# Patient Record
Sex: Female | Born: 1945
Health system: Southern US, Community
[De-identification: ages and names within clinical notes are randomized; demographics above are authoritative.]

## PROBLEM LIST (undated history)

## (undated) DIAGNOSIS — M8580 Other specified disorders of bone density and structure, unspecified site: Secondary | ICD-10-CM

## (undated) DIAGNOSIS — E7800 Pure hypercholesterolemia, unspecified: Secondary | ICD-10-CM

## (undated) DIAGNOSIS — M5090 Cervical disc disorder, unspecified, unspecified cervical region: Secondary | ICD-10-CM

## (undated) DIAGNOSIS — C9110 Chronic lymphocytic leukemia of B-cell type not having achieved remission: Secondary | ICD-10-CM

## (undated) DIAGNOSIS — K635 Polyp of colon: Secondary | ICD-10-CM

## (undated) DIAGNOSIS — M459 Ankylosing spondylitis of unspecified sites in spine: Secondary | ICD-10-CM

## (undated) DIAGNOSIS — I10 Essential (primary) hypertension: Secondary | ICD-10-CM

## (undated) DIAGNOSIS — J302 Other seasonal allergic rhinitis: Secondary | ICD-10-CM

## (undated) DIAGNOSIS — M069 Rheumatoid arthritis, unspecified: Secondary | ICD-10-CM

## (undated) DIAGNOSIS — Z789 Other specified health status: Secondary | ICD-10-CM

## (undated) DIAGNOSIS — R87619 Unspecified abnormal cytological findings in specimens from cervix uteri: Secondary | ICD-10-CM

## (undated) DIAGNOSIS — R9389 Abnormal findings on diagnostic imaging of other specified body structures: Secondary | ICD-10-CM

## (undated) DIAGNOSIS — M1990 Unspecified osteoarthritis, unspecified site: Secondary | ICD-10-CM

## (undated) DIAGNOSIS — F32A Depression, unspecified: Secondary | ICD-10-CM

## (undated) DIAGNOSIS — A0472 Enterocolitis due to Clostridium difficile, not specified as recurrent: Secondary | ICD-10-CM

## (undated) DIAGNOSIS — F329 Major depressive disorder, single episode, unspecified: Secondary | ICD-10-CM

## (undated) DIAGNOSIS — M810 Age-related osteoporosis without current pathological fracture: Secondary | ICD-10-CM

## (undated) DIAGNOSIS — K589 Irritable bowel syndrome without diarrhea: Secondary | ICD-10-CM

## (undated) HISTORY — DX: Pure hypercholesterolemia, unspecified: E78.00

## (undated) HISTORY — DX: Abnormal findings on diagnostic imaging of other specified body structures: R93.89

## (undated) HISTORY — DX: Essential (primary) hypertension: I10

## (undated) HISTORY — DX: Rheumatoid arthritis, unspecified: M06.9

## (undated) HISTORY — DX: Irritable bowel syndrome, unspecified: K58.9

## (undated) HISTORY — DX: Age-related osteoporosis without current pathological fracture: M81.0

## (undated) HISTORY — DX: Cervical disc disorder, unspecified, unspecified cervical region: M50.90

## (undated) HISTORY — DX: Chronic lymphocytic leukemia of B-cell type not having achieved remission: C91.10

## (undated) HISTORY — DX: Polyp of colon: K63.5

## (undated) HISTORY — PX: COLONOSCOPY W/ POLYPECTOMY: SHX1380

## (undated) HISTORY — PX: ENDOMETRIAL BIOPSY: SHX622

## (undated) HISTORY — DX: Other specified health status: Z78.9

## (undated) HISTORY — DX: Other seasonal allergic rhinitis: J30.2

## (undated) HISTORY — DX: Major depressive disorder, single episode, unspecified: F32.9

## (undated) HISTORY — PX: TUBAL LIGATION: SHX77

## (undated) HISTORY — DX: Unspecified osteoarthritis, unspecified site: M19.90

## (undated) HISTORY — DX: Depression, unspecified: F32.A

## (undated) HISTORY — DX: Other specified disorders of bone density and structure, unspecified site: M85.80

## (undated) HISTORY — DX: Ankylosing spondylitis of unspecified sites in spine: M45.9

## (undated) HISTORY — DX: Unspecified abnormal cytological findings in specimens from cervix uteri: R87.619

## (undated) HISTORY — DX: Enterocolitis due to Clostridium difficile, not specified as recurrent: A04.72

---

## 1998-10-23 ENCOUNTER — Other Ambulatory Visit: Admission: RE | Admit: 1998-10-23 | Discharge: 1998-10-23 | Payer: Self-pay | Admitting: Obstetrics and Gynecology

## 1998-12-15 ENCOUNTER — Ambulatory Visit (HOSPITAL_COMMUNITY): Admission: RE | Admit: 1998-12-15 | Discharge: 1998-12-15 | Payer: Self-pay | Admitting: Gastroenterology

## 2000-04-17 ENCOUNTER — Other Ambulatory Visit: Admission: RE | Admit: 2000-04-17 | Discharge: 2000-04-17 | Payer: Self-pay | Admitting: Obstetrics and Gynecology

## 2000-04-18 ENCOUNTER — Encounter: Admission: RE | Admit: 2000-04-18 | Discharge: 2000-04-18 | Payer: Self-pay | Admitting: Obstetrics and Gynecology

## 2000-04-18 ENCOUNTER — Encounter: Payer: Self-pay | Admitting: Obstetrics and Gynecology

## 2000-07-01 ENCOUNTER — Emergency Department (HOSPITAL_COMMUNITY): Admission: EM | Admit: 2000-07-01 | Discharge: 2000-07-01 | Payer: Self-pay | Admitting: Emergency Medicine

## 2001-04-30 ENCOUNTER — Other Ambulatory Visit: Admission: RE | Admit: 2001-04-30 | Discharge: 2001-04-30 | Payer: Self-pay | Admitting: Obstetrics and Gynecology

## 2001-05-23 ENCOUNTER — Encounter: Admission: RE | Admit: 2001-05-23 | Discharge: 2001-05-23 | Payer: Self-pay | Admitting: Obstetrics and Gynecology

## 2001-05-23 ENCOUNTER — Encounter: Payer: Self-pay | Admitting: Obstetrics and Gynecology

## 2002-05-27 ENCOUNTER — Encounter: Payer: Self-pay | Admitting: Obstetrics and Gynecology

## 2002-05-27 ENCOUNTER — Encounter: Admission: RE | Admit: 2002-05-27 | Discharge: 2002-05-27 | Payer: Self-pay | Admitting: Obstetrics and Gynecology

## 2002-08-15 ENCOUNTER — Other Ambulatory Visit: Admission: RE | Admit: 2002-08-15 | Discharge: 2002-08-15 | Payer: Self-pay | Admitting: Obstetrics and Gynecology

## 2003-05-30 ENCOUNTER — Encounter: Admission: RE | Admit: 2003-05-30 | Discharge: 2003-05-30 | Payer: Self-pay | Admitting: Obstetrics and Gynecology

## 2003-09-05 ENCOUNTER — Other Ambulatory Visit: Admission: RE | Admit: 2003-09-05 | Discharge: 2003-09-05 | Payer: Self-pay | Admitting: Obstetrics and Gynecology

## 2004-02-24 ENCOUNTER — Other Ambulatory Visit: Admission: RE | Admit: 2004-02-24 | Discharge: 2004-02-24 | Payer: Self-pay | Admitting: Hematology and Oncology

## 2004-03-19 ENCOUNTER — Ambulatory Visit (HOSPITAL_COMMUNITY): Admission: RE | Admit: 2004-03-19 | Discharge: 2004-03-19 | Payer: Self-pay | Admitting: Hematology and Oncology

## 2004-05-22 ENCOUNTER — Ambulatory Visit: Payer: Self-pay | Admitting: Hematology and Oncology

## 2004-05-31 ENCOUNTER — Encounter: Admission: RE | Admit: 2004-05-31 | Discharge: 2004-05-31 | Payer: Self-pay | Admitting: *Deleted

## 2004-09-09 ENCOUNTER — Other Ambulatory Visit: Admission: RE | Admit: 2004-09-09 | Discharge: 2004-09-09 | Payer: Self-pay | Admitting: *Deleted

## 2004-11-29 ENCOUNTER — Ambulatory Visit: Payer: Self-pay | Admitting: Hematology and Oncology

## 2005-06-01 ENCOUNTER — Ambulatory Visit: Payer: Self-pay | Admitting: Hematology and Oncology

## 2005-07-14 ENCOUNTER — Encounter: Admission: RE | Admit: 2005-07-14 | Discharge: 2005-07-14 | Payer: Self-pay | Admitting: *Deleted

## 2005-10-13 ENCOUNTER — Other Ambulatory Visit: Admission: RE | Admit: 2005-10-13 | Discharge: 2005-10-13 | Payer: Self-pay | Admitting: Obstetrics & Gynecology

## 2006-01-21 ENCOUNTER — Encounter: Admission: RE | Admit: 2006-01-21 | Discharge: 2006-01-21 | Payer: Self-pay | Admitting: Obstetrics & Gynecology

## 2006-02-28 ENCOUNTER — Ambulatory Visit: Payer: Self-pay | Admitting: Hematology and Oncology

## 2006-03-02 LAB — CBC WITH DIFFERENTIAL/PLATELET
BASO%: 0.2 % (ref 0.0–2.0)
Basophils Absolute: 0.1 10*3/uL (ref 0.0–0.1)
EOS%: 0.6 % (ref 0.0–7.0)
Eosinophils Absolute: 0.1 10*3/uL (ref 0.0–0.5)
HCT: 37.8 % (ref 34.8–46.6)
HGB: 12.9 g/dL (ref 11.6–15.9)
LYMPH%: 72 % — ABNORMAL HIGH (ref 14.0–48.0)
MCH: 30 pg (ref 26.0–34.0)
MCHC: 34.2 g/dL (ref 32.0–36.0)
MCV: 87.9 fL (ref 81.0–101.0)
MONO#: 0.9 10*3/uL (ref 0.1–0.9)
MONO%: 3.9 % (ref 0.0–13.0)
NEUT#: 5.3 10*3/uL (ref 1.5–6.5)
NEUT%: 23.3 % — ABNORMAL LOW (ref 39.6–76.8)
Platelets: 280 10*3/uL (ref 145–400)
RBC: 4.3 10*6/uL (ref 3.70–5.32)
RDW: 13.6 % (ref 11.3–14.5)
WBC: 22.6 10*3/uL — ABNORMAL HIGH (ref 3.9–10.0)
lymph#: 16.3 10*3/uL — ABNORMAL HIGH (ref 0.9–3.3)

## 2006-03-02 LAB — TECHNOLOGIST REVIEW

## 2006-03-03 LAB — PROTEIN ELECTROPHORESIS, SERUM
Albumin ELP: 62.4 % (ref 55.8–66.1)
Alpha-1-Globulin: 5.3 % — ABNORMAL HIGH (ref 2.9–4.9)
Alpha-2-Globulin: 11.3 % (ref 7.1–11.8)
Beta 2: 4.1 % (ref 3.2–6.5)
Beta Globulin: 6.6 % (ref 4.7–7.2)
Gamma Globulin: 10.3 % — ABNORMAL LOW (ref 11.1–18.8)
Total Protein, Serum Electrophoresis: 6.6 g/dL (ref 6.0–8.3)

## 2006-03-03 LAB — LACTATE DEHYDROGENASE: LDH: 117 U/L (ref 94–250)

## 2006-03-03 LAB — COMPREHENSIVE METABOLIC PANEL
ALT: 17 U/L (ref 0–40)
AST: 15 U/L (ref 0–37)
Albumin: 4.4 g/dL (ref 3.5–5.2)
Alkaline Phosphatase: 77 U/L (ref 39–117)
BUN: 10 mg/dL (ref 6–23)
CO2: 27 mEq/L (ref 19–32)
Calcium: 9.6 mg/dL (ref 8.4–10.5)
Chloride: 101 mEq/L (ref 96–112)
Creatinine, Ser: 0.61 mg/dL (ref 0.40–1.20)
Glucose, Bld: 119 mg/dL — ABNORMAL HIGH (ref 70–99)
Potassium: 3.8 mEq/L (ref 3.5–5.3)
Sodium: 137 mEq/L (ref 135–145)
Total Bilirubin: 0.4 mg/dL (ref 0.3–1.2)
Total Protein: 6.6 g/dL (ref 6.0–8.3)

## 2006-03-10 ENCOUNTER — Encounter (INDEPENDENT_AMBULATORY_CARE_PROVIDER_SITE_OTHER): Payer: Self-pay | Admitting: Specialist

## 2006-03-10 ENCOUNTER — Ambulatory Visit (HOSPITAL_BASED_OUTPATIENT_CLINIC_OR_DEPARTMENT_OTHER): Admission: RE | Admit: 2006-03-10 | Discharge: 2006-03-10 | Payer: Self-pay | Admitting: Obstetrics & Gynecology

## 2006-07-17 ENCOUNTER — Encounter: Admission: RE | Admit: 2006-07-17 | Discharge: 2006-07-17 | Payer: Self-pay | Admitting: Obstetrics & Gynecology

## 2006-10-16 ENCOUNTER — Other Ambulatory Visit: Admission: RE | Admit: 2006-10-16 | Discharge: 2006-10-16 | Payer: Self-pay | Admitting: Obstetrics & Gynecology

## 2006-11-13 DIAGNOSIS — C9110 Chronic lymphocytic leukemia of B-cell type not having achieved remission: Secondary | ICD-10-CM | POA: Insufficient documentation

## 2006-11-28 ENCOUNTER — Ambulatory Visit: Payer: Self-pay | Admitting: Hematology and Oncology

## 2006-12-01 LAB — CBC WITH DIFFERENTIAL/PLATELET
BASO%: 0.3 % (ref 0.0–2.0)
Basophils Absolute: 0.1 10*3/uL (ref 0.0–0.1)
EOS%: 0.6 % (ref 0.0–7.0)
Eosinophils Absolute: 0.2 10*3/uL (ref 0.0–0.5)
HCT: 36.8 % (ref 34.8–46.6)
HGB: 12.9 g/dL (ref 11.6–15.9)
LYMPH%: 79.4 % — ABNORMAL HIGH (ref 14.0–48.0)
MCH: 30.3 pg (ref 26.0–34.0)
MCHC: 35 g/dL (ref 32.0–36.0)
MCV: 86.6 fL (ref 81.0–101.0)
MONO#: 1.3 10*3/uL — ABNORMAL HIGH (ref 0.1–0.9)
MONO%: 4.7 % (ref 0.0–13.0)
NEUT#: 4.3 10*3/uL (ref 1.5–6.5)
NEUT%: 15 % — ABNORMAL LOW (ref 39.6–76.8)
Platelets: 269 10*3/uL (ref 145–400)
RBC: 4.26 10*6/uL (ref 3.70–5.32)
RDW: 12.7 % (ref 11.3–14.5)
WBC: 28.4 10*3/uL — ABNORMAL HIGH (ref 3.9–10.0)
lymph#: 22.5 10*3/uL — ABNORMAL HIGH (ref 0.9–3.3)

## 2006-12-01 LAB — LACTATE DEHYDROGENASE: LDH: 147 U/L (ref 94–250)

## 2006-12-01 LAB — COMPREHENSIVE METABOLIC PANEL
ALT: 35 U/L (ref 0–35)
AST: 25 U/L (ref 0–37)
Albumin: 4.5 g/dL (ref 3.5–5.2)
Alkaline Phosphatase: 92 U/L (ref 39–117)
BUN: 14 mg/dL (ref 6–23)
CO2: 28 mEq/L (ref 19–32)
Calcium: 10.2 mg/dL (ref 8.4–10.5)
Chloride: 100 mEq/L (ref 96–112)
Creatinine, Ser: 0.6 mg/dL (ref 0.40–1.20)
Glucose, Bld: 89 mg/dL (ref 70–99)
Potassium: 3.8 mEq/L (ref 3.5–5.3)
Sodium: 139 mEq/L (ref 135–145)
Total Bilirubin: 0.6 mg/dL (ref 0.3–1.2)
Total Protein: 6.9 g/dL (ref 6.0–8.3)

## 2007-02-21 ENCOUNTER — Encounter: Admission: RE | Admit: 2007-02-21 | Discharge: 2007-02-21 | Payer: Self-pay | Admitting: Obstetrics and Gynecology

## 2007-07-19 ENCOUNTER — Encounter: Admission: RE | Admit: 2007-07-19 | Discharge: 2007-07-19 | Payer: Self-pay | Admitting: Obstetrics & Gynecology

## 2007-08-29 ENCOUNTER — Ambulatory Visit: Payer: Self-pay | Admitting: Hematology and Oncology

## 2007-08-31 LAB — CBC WITH DIFFERENTIAL/PLATELET
BASO%: 0.2 % (ref 0.0–2.0)
Basophils Absolute: 0.1 10*3/uL (ref 0.0–0.1)
EOS%: 0.6 % (ref 0.0–7.0)
Eosinophils Absolute: 0.2 10*3/uL (ref 0.0–0.5)
HCT: 38 % (ref 34.8–46.6)
HGB: 12.7 g/dL (ref 11.6–15.9)
LYMPH%: 83.7 % — ABNORMAL HIGH (ref 14.0–48.0)
MCH: 28.9 pg (ref 26.0–34.0)
MCHC: 33.5 g/dL (ref 32.0–36.0)
MCV: 86.1 fL (ref 81.0–101.0)
MONO#: 0.6 10*3/uL (ref 0.1–0.9)
MONO%: 1.8 % (ref 0.0–13.0)
NEUT#: 4.3 10*3/uL (ref 1.5–6.5)
NEUT%: 13.7 % — ABNORMAL LOW (ref 39.6–76.8)
Platelets: 314 10*3/uL (ref 145–400)
RBC: 4.41 10*6/uL (ref 3.70–5.32)
RDW: 12.7 % (ref 11.3–14.5)
WBC: 31.3 10*3/uL — ABNORMAL HIGH (ref 3.9–10.0)
lymph#: 26.2 10*3/uL — ABNORMAL HIGH (ref 0.9–3.3)

## 2007-08-31 LAB — COMPREHENSIVE METABOLIC PANEL
ALT: 19 U/L (ref 0–35)
AST: 18 U/L (ref 0–37)
Albumin: 4.7 g/dL (ref 3.5–5.2)
Alkaline Phosphatase: 109 U/L (ref 39–117)
BUN: 20 mg/dL (ref 6–23)
CO2: 27 mEq/L (ref 19–32)
Calcium: 10 mg/dL (ref 8.4–10.5)
Chloride: 100 mEq/L (ref 96–112)
Creatinine, Ser: 0.67 mg/dL (ref 0.40–1.20)
Glucose, Bld: 90 mg/dL (ref 70–99)
Potassium: 3.8 mEq/L (ref 3.5–5.3)
Sodium: 137 mEq/L (ref 135–145)
Total Bilirubin: 0.3 mg/dL (ref 0.3–1.2)
Total Protein: 7.1 g/dL (ref 6.0–8.3)

## 2007-08-31 LAB — LACTATE DEHYDROGENASE: LDH: 145 U/L (ref 94–250)

## 2007-09-11 LAB — IGG, IGA, IGM
IgA: 135 mg/dL (ref 68–378)
IgG (Immunoglobin G), Serum: 594 mg/dL — ABNORMAL LOW (ref 694–1618)
IgM, Serum: 86 mg/dL (ref 60–263)

## 2007-09-11 LAB — PROTEIN ELECTROPHORESIS, SERUM
Albumin ELP: 64.3 % (ref 55.8–66.1)
Alpha-1-Globulin: 4.8 % (ref 2.9–4.9)
Alpha-2-Globulin: 10.6 % (ref 7.1–11.8)
Beta 2: 3.9 % (ref 3.2–6.5)
Beta Globulin: 6.3 % (ref 4.7–7.2)
Gamma Globulin: 10.1 % — ABNORMAL LOW (ref 11.1–18.8)
Total Protein, Serum Electrophoresis: 7.3 g/dL (ref 6.0–8.3)

## 2007-10-18 ENCOUNTER — Other Ambulatory Visit: Admission: RE | Admit: 2007-10-18 | Discharge: 2007-10-18 | Payer: Self-pay | Admitting: Obstetrics and Gynecology

## 2008-05-28 ENCOUNTER — Ambulatory Visit: Payer: Self-pay | Admitting: Hematology and Oncology

## 2008-06-12 LAB — CBC WITH DIFFERENTIAL/PLATELET
BASO%: 0.2 % (ref 0.0–2.0)
Basophils Absolute: 0.1 10*3/uL (ref 0.0–0.1)
EOS%: 0.6 % (ref 0.0–7.0)
Eosinophils Absolute: 0.1 10*3/uL (ref 0.0–0.5)
HCT: 35.4 % (ref 34.8–46.6)
HGB: 12 g/dL (ref 11.6–15.9)
LYMPH%: 83.8 % — ABNORMAL HIGH (ref 14.0–48.0)
MCH: 29.8 pg (ref 26.0–34.0)
MCHC: 33.8 g/dL (ref 32.0–36.0)
MCV: 88.1 fL (ref 81.0–101.0)
MONO#: 0.2 10*3/uL (ref 0.1–0.9)
MONO%: 0.8 % (ref 0.0–13.0)
NEUT#: 3.7 10*3/uL (ref 1.5–6.5)
NEUT%: 14.6 % — ABNORMAL LOW (ref 39.6–76.8)
Platelets: 240 10*3/uL (ref 145–400)
RBC: 4.02 10*6/uL (ref 3.70–5.32)
RDW: 13.3 % (ref 11.3–14.5)
WBC: 25.1 10*3/uL — ABNORMAL HIGH (ref 3.9–10.0)
lymph#: 21.1 10*3/uL — ABNORMAL HIGH (ref 0.9–3.3)

## 2008-06-12 LAB — COMPREHENSIVE METABOLIC PANEL
ALT: 16 U/L (ref 0–35)
AST: 15 U/L (ref 0–37)
Albumin: 4.2 g/dL (ref 3.5–5.2)
Alkaline Phosphatase: 88 U/L (ref 39–117)
BUN: 19 mg/dL (ref 6–23)
CO2: 26 mEq/L (ref 19–32)
Calcium: 9.8 mg/dL (ref 8.4–10.5)
Chloride: 104 mEq/L (ref 96–112)
Creatinine, Ser: 0.6 mg/dL (ref 0.40–1.20)
Glucose, Bld: 85 mg/dL (ref 70–99)
Potassium: 3.9 mEq/L (ref 3.5–5.3)
Sodium: 139 mEq/L (ref 135–145)
Total Bilirubin: 0.4 mg/dL (ref 0.3–1.2)
Total Protein: 6.3 g/dL (ref 6.0–8.3)

## 2008-06-12 LAB — TECHNOLOGIST REVIEW

## 2008-06-12 LAB — LACTATE DEHYDROGENASE: LDH: 134 U/L (ref 94–250)

## 2008-09-04 ENCOUNTER — Encounter: Admission: RE | Admit: 2008-09-04 | Discharge: 2008-09-04 | Payer: Self-pay | Admitting: Obstetrics & Gynecology

## 2008-10-20 ENCOUNTER — Other Ambulatory Visit: Admission: RE | Admit: 2008-10-20 | Discharge: 2008-10-20 | Payer: Self-pay | Admitting: Obstetrics & Gynecology

## 2009-03-10 ENCOUNTER — Encounter: Admission: RE | Admit: 2009-03-10 | Discharge: 2009-03-10 | Payer: Self-pay | Admitting: Colon and Rectal Surgery

## 2009-06-04 ENCOUNTER — Ambulatory Visit: Payer: Self-pay | Admitting: Internal Medicine

## 2009-06-04 LAB — COMPREHENSIVE METABOLIC PANEL
ALT: 13 U/L (ref 0–35)
AST: 14 U/L (ref 0–37)
Albumin: 4.7 g/dL (ref 3.5–5.2)
Alkaline Phosphatase: 102 U/L (ref 39–117)
BUN: 15 mg/dL (ref 6–23)
CO2: 28 mEq/L (ref 19–32)
Calcium: 10.2 mg/dL (ref 8.4–10.5)
Chloride: 102 mEq/L (ref 96–112)
Creatinine, Ser: 0.62 mg/dL (ref 0.40–1.20)
Glucose, Bld: 100 mg/dL — ABNORMAL HIGH (ref 70–99)
Potassium: 3.7 mEq/L (ref 3.5–5.3)
Sodium: 140 mEq/L (ref 135–145)
Total Bilirubin: 0.5 mg/dL (ref 0.3–1.2)
Total Protein: 6.8 g/dL (ref 6.0–8.3)

## 2009-06-04 LAB — CBC WITH DIFFERENTIAL/PLATELET
BASO%: 0.2 % (ref 0.0–2.0)
Basophils Absolute: 0.1 10*3/uL (ref 0.0–0.1)
EOS%: 0.4 % (ref 0.0–7.0)
Eosinophils Absolute: 0.1 10*3/uL (ref 0.0–0.5)
HCT: 36.7 % (ref 34.8–46.6)
HGB: 12.4 g/dL (ref 11.6–15.9)
LYMPH%: 80.2 % — ABNORMAL HIGH (ref 14.0–49.7)
MCH: 29.8 pg (ref 25.1–34.0)
MCHC: 33.8 g/dL (ref 31.5–36.0)
MCV: 88.2 fL (ref 79.5–101.0)
MONO#: 1.1 10*3/uL — ABNORMAL HIGH (ref 0.1–0.9)
MONO%: 3.6 % (ref 0.0–14.0)
NEUT#: 4.6 10*3/uL (ref 1.5–6.5)
NEUT%: 15.6 % — ABNORMAL LOW (ref 38.4–76.8)
Platelets: 271 10*3/uL (ref 145–400)
RBC: 4.16 10*6/uL (ref 3.70–5.45)
RDW: 13.2 % (ref 11.2–14.5)
WBC: 29.4 10*3/uL — ABNORMAL HIGH (ref 3.9–10.3)
lymph#: 23.5 10*3/uL — ABNORMAL HIGH (ref 0.9–3.3)

## 2009-06-04 LAB — LACTATE DEHYDROGENASE: LDH: 137 U/L (ref 94–250)

## 2009-06-14 ENCOUNTER — Emergency Department (HOSPITAL_COMMUNITY): Admission: EM | Admit: 2009-06-14 | Discharge: 2009-06-14 | Payer: Self-pay | Admitting: Emergency Medicine

## 2009-08-12 ENCOUNTER — Ambulatory Visit: Payer: Self-pay | Admitting: Hematology and Oncology

## 2009-08-14 LAB — CBC WITH DIFFERENTIAL/PLATELET
BASO%: 0.2 % (ref 0.0–2.0)
Basophils Absolute: 0.1 10*3/uL (ref 0.0–0.1)
EOS%: 0.7 % (ref 0.0–7.0)
Eosinophils Absolute: 0.2 10*3/uL (ref 0.0–0.5)
HCT: 35 % (ref 34.8–46.6)
HGB: 11.7 g/dL (ref 11.6–15.9)
LYMPH%: 83.9 % — ABNORMAL HIGH (ref 14.0–49.7)
MCH: 28.5 pg (ref 25.1–34.0)
MCHC: 33.4 g/dL (ref 31.5–36.0)
MCV: 85.4 fL (ref 79.5–101.0)
MONO#: 0.5 10*3/uL (ref 0.1–0.9)
MONO%: 2.2 % (ref 0.0–14.0)
NEUT#: 3.1 10*3/uL (ref 1.5–6.5)
NEUT%: 13 % — ABNORMAL LOW (ref 38.4–76.8)
Platelets: 228 10*3/uL (ref 145–400)
RBC: 4.1 10*6/uL (ref 3.70–5.45)
RDW: 13 % (ref 11.2–14.5)
WBC: 23.7 10*3/uL — ABNORMAL HIGH (ref 3.9–10.3)
lymph#: 19.9 10*3/uL — ABNORMAL HIGH (ref 0.9–3.3)

## 2009-08-14 LAB — COMPREHENSIVE METABOLIC PANEL
ALT: 19 U/L (ref 0–35)
AST: 15 U/L (ref 0–37)
Albumin: 4.4 g/dL (ref 3.5–5.2)
Alkaline Phosphatase: 114 U/L (ref 39–117)
BUN: 17 mg/dL (ref 6–23)
CO2: 22 mEq/L (ref 19–32)
Calcium: 9.3 mg/dL (ref 8.4–10.5)
Chloride: 103 mEq/L (ref 96–112)
Creatinine, Ser: 0.53 mg/dL (ref 0.40–1.20)
Glucose, Bld: 97 mg/dL (ref 70–99)
Potassium: 4 mEq/L (ref 3.5–5.3)
Sodium: 139 mEq/L (ref 135–145)
Total Bilirubin: 0.4 mg/dL (ref 0.3–1.2)
Total Protein: 6.4 g/dL (ref 6.0–8.3)

## 2009-08-14 LAB — LACTATE DEHYDROGENASE: LDH: 129 U/L (ref 94–250)

## 2009-08-14 LAB — TECHNOLOGIST REVIEW

## 2009-09-08 ENCOUNTER — Encounter: Admission: RE | Admit: 2009-09-08 | Discharge: 2009-09-08 | Payer: Self-pay | Admitting: Orthopedic Surgery

## 2010-07-09 ENCOUNTER — Encounter
Admission: RE | Admit: 2010-07-09 | Discharge: 2010-07-09 | Payer: Self-pay | Source: Home / Self Care | Attending: Obstetrics & Gynecology | Admitting: Obstetrics & Gynecology

## 2010-08-09 ENCOUNTER — Ambulatory Visit: Payer: Self-pay | Admitting: Hematology and Oncology

## 2010-08-11 LAB — CBC WITH DIFFERENTIAL/PLATELET
BASO%: 0.3 % (ref 0.0–2.0)
Basophils Absolute: 0.1 10*3/uL (ref 0.0–0.1)
EOS%: 0.9 % (ref 0.0–7.0)
Eosinophils Absolute: 0.2 10*3/uL (ref 0.0–0.5)
HCT: 36.3 % (ref 34.8–46.6)
HGB: 12.3 g/dL (ref 11.6–15.9)
LYMPH%: 80.1 % — ABNORMAL HIGH (ref 14.0–49.7)
MCH: 29.8 pg (ref 25.1–34.0)
MCHC: 33.9 g/dL (ref 31.5–36.0)
MCV: 87.8 fL (ref 79.5–101.0)
MONO#: 0.3 10*3/uL (ref 0.1–0.9)
MONO%: 1.5 % (ref 0.0–14.0)
NEUT#: 3.8 10*3/uL (ref 1.5–6.5)
NEUT%: 17.2 % — ABNORMAL LOW (ref 38.4–76.8)
Platelets: 231 10*3/uL (ref 145–400)
RBC: 4.14 10*6/uL (ref 3.70–5.45)
RDW: 12.9 % (ref 11.2–14.5)
WBC: 22.1 10*3/uL — ABNORMAL HIGH (ref 3.9–10.3)
lymph#: 17.7 10*3/uL — ABNORMAL HIGH (ref 0.9–3.3)

## 2010-08-11 LAB — COMPREHENSIVE METABOLIC PANEL
ALT: 19 U/L (ref 0–35)
AST: 17 U/L (ref 0–37)
Albumin: 4.6 g/dL (ref 3.5–5.2)
Alkaline Phosphatase: 97 U/L (ref 39–117)
BUN: 16 mg/dL (ref 6–23)
CO2: 28 mEq/L (ref 19–32)
Calcium: 9.9 mg/dL (ref 8.4–10.5)
Chloride: 104 mEq/L (ref 96–112)
Creatinine, Ser: 0.63 mg/dL (ref 0.40–1.20)
Glucose, Bld: 79 mg/dL (ref 70–99)
Potassium: 3.8 mEq/L (ref 3.5–5.3)
Sodium: 142 mEq/L (ref 135–145)
Total Bilirubin: 0.5 mg/dL (ref 0.3–1.2)
Total Protein: 6.6 g/dL (ref 6.0–8.3)

## 2010-08-11 LAB — LACTATE DEHYDROGENASE: LDH: 133 U/L (ref 94–250)

## 2010-08-22 ENCOUNTER — Encounter: Payer: Self-pay | Admitting: Hematology and Oncology

## 2010-12-17 NOTE — Op Note (Signed)
NAME:  Audrey Sweeney, Audrey Sweeney              ACCOUNT NO.:  [id number]   MEDICAL RECORD NO.:  [id number]          PATIENT TYPE:  AMB   LOCATION:  [facility]                         FACILITY:  [facility]   PHYSICIAN:  M. Leda Quail, MD  DATE OF BIRTH:  [birth year]   DATE OF PROCEDURE:  DATE OF DISCHARGE:                                 OPERATIVE REPORT   PREOPERATIVE DIAGNOSIS:  [age]-year-old white female with dysfunctional  uterine bleeding and endometrial polyp.   POSTOPERATIVE DIAGNOSIS:  [age]-year-old white female with  dysfunctional uterine bleeding and endometrial polyp.   PROCEDURE:  1. Hysterectomy.  2. Dilatation and curettage.   SURGEON:  M. Leda Quail, MD   ANESTHESIA:  General anesthesia using an LMA.   SPECIMENS:  Endometrial curettings and endometrial polyp.   ESTIMATED BLOOD LOSS:  Minimal.   URINE OUTPUT:  20 mL.   FLUIDS:  1100 mL.   COMPLICATIONS:  None.   INDICATIONS:  Ms. Petrasek is a very pleasant 65 year old white female who  has a history of dysfunctional uterine bleeding.  She has been having cycles  up until the last year or so.  This year, they have been erratic and quite  far-spaced.  She does not want to be on any hormone manipulation.  I did do  an office endometrial biopsy, which was benign, and this showed tissue  consistent with endometrial polyps.  She and I decided to attempt a  sonohysterogram in the office to see if we could deep visualize it, or if it  was very small, but I could not cannulate the IUI catheter through the  cervix due to what felt like a little ridge at the edge of the endocervical  canal.  I therefore decided to this in the operating room.   PROCEDURE:  The patient was taken to the operating room and she was placed  in a supine position.  Anesthesia was administered by the anesthesia staff  without difficulty.  She was then placed in [place] stirrups and in the dorsal  lithotomy position.  Perineum, legs, inner thighs  and vagina were prepped  and draped in normal sterile fashion.  A red rubber foley catheter was used  to drain the bladder of all urine.  She was then draped in a normal standard  fashion.  A heavy-weighted speculum was placed in the vagina.  Using a Sims  retractor, the cervix was visualized.  The anterior lip was grasped with a  single-tooth tenaculum.  The uterus sounded to 9 cm.  Then using Pratt  dilators, the cervix was dilated up to a #27.  Attempt was made to pass the  hysteroscope, but at the level of the internal os, there was some  tightening; therefore, the cervix was dilated up to a #29.  Again, the  hysteroscope with the resectoscope attached could not be passed, but this  was tried again at a 31 Pratt dilator and then finally a 33 and at this  point, the hysteroscope could be advanced easily.  The fundus was noted.  There did appear to be some irregular-looking tissue on the anterior aspect  of the endometrial cavity.  This tissue was consistent with an endometrial  polyp; however, it was sessile and decision was made to go ahead and do a  D&C instead of using the resectoscope.  The hysteroscope was removed and  then using a #1 smooth curette, the endometrial cavity was curetted until a  rough gritty texture was noted in all quadrants.  The curettings were placed  on Telfa and handed off for pathologic analysis.  Then using the  hysteroscope, the endometrial cavity was reinspected; that irregular-looking  tissue on the anterior aspect of the endometrial cavity was gone and there  was no evidence of polyp at this point and the endometrial tissue appeared  very thin.  At this point, the procedure was ended.  The hysteroscope was  removed.  The tenaculum was removed from the cervix and no bleeding was  noted.  All instruments were removed from the vagina.  There was a 35-mL  deficit of Sorbitol, which was the hysteroscopic medium that was used for  the procedure.  The patient  was positioned back in the supine position.  Sponge, lap, needle and instrument counts were correct x2.  The patient was  awakened from anesthesia and taken to the recovery room in stable condition.      Lum Keas, MD  Electronically Signed     MSM/MEDQ  D:  03/10/2006  T:  03/10/2006  Job:  [id number]

## 2011-06-22 ENCOUNTER — Other Ambulatory Visit: Payer: Self-pay | Admitting: Obstetrics & Gynecology

## 2011-06-22 DIAGNOSIS — Z1231 Encounter for screening mammogram for malignant neoplasm of breast: Secondary | ICD-10-CM

## 2011-07-11 ENCOUNTER — Ambulatory Visit: Payer: Self-pay

## 2011-07-12 ENCOUNTER — Ambulatory Visit: Payer: Self-pay

## 2011-07-18 ENCOUNTER — Ambulatory Visit
Admission: RE | Admit: 2011-07-18 | Discharge: 2011-07-18 | Disposition: A | Discharge status: Home / Self Care | Payer: Medicare Other | Source: Ambulatory Visit | Attending: Obstetrics & Gynecology | Admitting: Obstetrics & Gynecology

## 2011-07-18 DIAGNOSIS — Z1231 Encounter for screening mammogram for malignant neoplasm of breast: Secondary | ICD-10-CM

## 2011-07-21 ENCOUNTER — Other Ambulatory Visit: Payer: Self-pay | Admitting: Obstetrics & Gynecology

## 2011-07-21 DIAGNOSIS — R928 Other abnormal and inconclusive findings on diagnostic imaging of breast: Secondary | ICD-10-CM

## 2011-07-23 ENCOUNTER — Telehealth: Payer: Self-pay | Admitting: Hematology and Oncology

## 2011-07-23 NOTE — Telephone Encounter (Signed)
S/w the pt's husband regarding the r/s appts from 08/12/2011 to 08/23/2011

## 2011-08-03 ENCOUNTER — Ambulatory Visit
Admission: RE | Admit: 2011-08-03 | Discharge: 2011-08-03 | Disposition: A | Discharge status: Home / Self Care | Payer: Medicare Other | Source: Ambulatory Visit | Attending: Obstetrics & Gynecology | Admitting: Obstetrics & Gynecology

## 2011-08-03 DIAGNOSIS — R928 Other abnormal and inconclusive findings on diagnostic imaging of breast: Secondary | ICD-10-CM

## 2011-08-22 ENCOUNTER — Encounter: Payer: Self-pay | Admitting: *Deleted

## 2011-08-24 ENCOUNTER — Other Ambulatory Visit (HOSPITAL_BASED_OUTPATIENT_CLINIC_OR_DEPARTMENT_OTHER): Payer: Medicare Other | Admitting: Lab

## 2011-08-24 ENCOUNTER — Ambulatory Visit (HOSPITAL_BASED_OUTPATIENT_CLINIC_OR_DEPARTMENT_OTHER): Payer: Medicare Other | Admitting: Hematology and Oncology

## 2011-08-24 ENCOUNTER — Telehealth: Payer: Self-pay | Admitting: Hematology and Oncology

## 2011-08-24 VITALS — BP 123/68 | HR 75 | Temp 97.1°F | Ht 65.0 in | Wt 143.0 lb

## 2011-08-24 DIAGNOSIS — C9110 Chronic lymphocytic leukemia of B-cell type not having achieved remission: Secondary | ICD-10-CM

## 2011-08-24 DIAGNOSIS — J329 Chronic sinusitis, unspecified: Secondary | ICD-10-CM

## 2011-08-24 LAB — CBC WITH DIFFERENTIAL/PLATELET
BASO%: 0.2 % (ref 0.0–2.0)
Basophils Absolute: 0.1 10*3/uL (ref 0.0–0.1)
EOS%: 0.6 % (ref 0.0–7.0)
Eosinophils Absolute: 0.1 10*3/uL (ref 0.0–0.5)
HCT: 36.7 % (ref 34.8–46.6)
HGB: 12.5 g/dL (ref 11.6–15.9)
LYMPH%: 78.5 % — ABNORMAL HIGH (ref 14.0–49.7)
MCH: 29.7 pg (ref 25.1–34.0)
MCHC: 34 g/dL (ref 31.5–36.0)
MCV: 87.3 fL (ref 79.5–101.0)
MONO#: 0.5 10*3/uL (ref 0.1–0.9)
MONO%: 2.5 % (ref 0.0–14.0)
NEUT#: 3.8 10*3/uL (ref 1.5–6.5)
NEUT%: 18.2 % — ABNORMAL LOW (ref 38.4–76.8)
Platelets: 221 10*3/uL (ref 145–400)
RBC: 4.2 10*6/uL (ref 3.70–5.45)
RDW: 13 % (ref 11.2–14.5)
WBC: 20.9 10*3/uL — ABNORMAL HIGH (ref 3.9–10.3)
lymph#: 16.4 10*3/uL — ABNORMAL HIGH (ref 0.9–3.3)

## 2011-08-24 LAB — COMPREHENSIVE METABOLIC PANEL
ALT: 20 U/L (ref 0–35)
AST: 20 U/L (ref 0–37)
Albumin: 4.6 g/dL (ref 3.5–5.2)
Alkaline Phosphatase: 100 U/L (ref 39–117)
BUN: 15 mg/dL (ref 6–23)
CO2: 24 mEq/L (ref 19–32)
Calcium: 9.9 mg/dL (ref 8.4–10.5)
Chloride: 104 mEq/L (ref 96–112)
Creatinine, Ser: 0.53 mg/dL (ref 0.50–1.10)
Glucose, Bld: 85 mg/dL (ref 70–99)
Potassium: 3.9 mEq/L (ref 3.5–5.3)
Sodium: 140 mEq/L (ref 135–145)
Total Bilirubin: 0.5 mg/dL (ref 0.3–1.2)
Total Protein: 6.3 g/dL (ref 6.0–8.3)

## 2011-08-24 LAB — LACTATE DEHYDROGENASE: LDH: 144 U/L (ref 94–250)

## 2011-08-24 NOTE — Progress Notes (Signed)
CC:   Audrey Sweeney, M.D. Griffith Citron, M.D. Juluis Rainier, MD M. Leda Quail, MD  IDENTIFYING STATEMENT:  Patient is a 66 year old woman with CLL who presents for followup.  INTERVAL HISTORY:  Audrey Sweeney was last seen a year ago.  Has had no major issues or concerns since her visit.  Denies adenopathy.  Does have an ongoing sinus type infection and admits to coughing green sputum with postnasal drip.  Remains afebrile.  Her weight is stable.  MEDICATIONS:  Reviewed and updated.  ALLERGIES:  IV contrast.  PHYSICAL EXAMINATION:  Patient is alert, oriented x3.  Vitals:  Pulse 75, blood pressure 120/65, temperature 96.1, respirations 20, weight 143 pounds.  HEENT:  Head is atraumatic, normocephalic.  Sclerae anicteric. Mouth moist.  Neck:  Supple.  Chest:  Clear to percussion and auscultation.  Abdomen:  Soft, nontender.  Bowel sounds present. Extremities:  No edema.  Lymph nodes:  No adenopathy.  CNS:  Nonfocal.  LABORATORY DATA:  08/24/2011: White cell count 20.9 (22.1).  Hemoglobin 12.5, hematocrit 36.7, platelets 221.  Lymphocytes were 16.4%.  BMET pending.  IMPRESSION AND PLAN:  Audrey Sweeney is a 66 year old woman with stage 0 chronic lymphocytic leukemia.  Her counts are stable.  Audrey Sweeney will continue surveillance and follow up in a year's time.  Prescription was written for Z-Pak for sinus type infection.  She was advised that if her symptoms did not improve, she was to follow up with her primary care physician.    ______________________________ Laurice Record, M.D. LIO/MEDQ  D:  08/24/2011  T:  08/24/2011  Job:  [id number]

## 2011-08-24 NOTE — Telephone Encounter (Signed)
Pt appt made and printed for 08/22/2012.

## 2011-08-24 NOTE — Progress Notes (Signed)
This office note has been dictated.

## 2011-09-13 DIAGNOSIS — J019 Acute sinusitis, unspecified: Secondary | ICD-10-CM | POA: Diagnosis not present

## 2011-09-13 DIAGNOSIS — L570 Actinic keratosis: Secondary | ICD-10-CM | POA: Diagnosis not present

## 2011-09-13 DIAGNOSIS — D239 Other benign neoplasm of skin, unspecified: Secondary | ICD-10-CM | POA: Diagnosis not present

## 2011-09-13 DIAGNOSIS — L821 Other seborrheic keratosis: Secondary | ICD-10-CM | POA: Diagnosis not present

## 2011-09-29 DIAGNOSIS — N390 Urinary tract infection, site not specified: Secondary | ICD-10-CM | POA: Diagnosis not present

## 2011-09-29 DIAGNOSIS — B373 Candidiasis of vulva and vagina: Secondary | ICD-10-CM | POA: Diagnosis not present

## 2011-11-07 DIAGNOSIS — Z01419 Encounter for gynecological examination (general) (routine) without abnormal findings: Secondary | ICD-10-CM | POA: Diagnosis not present

## 2011-11-07 DIAGNOSIS — E559 Vitamin D deficiency, unspecified: Secondary | ICD-10-CM | POA: Diagnosis not present

## 2011-11-07 DIAGNOSIS — Z9189 Other specified personal risk factors, not elsewhere classified: Secondary | ICD-10-CM | POA: Diagnosis not present

## 2011-11-07 DIAGNOSIS — Z124 Encounter for screening for malignant neoplasm of cervix: Secondary | ICD-10-CM | POA: Diagnosis not present

## 2012-02-14 DIAGNOSIS — H524 Presbyopia: Secondary | ICD-10-CM | POA: Diagnosis not present

## 2012-02-14 DIAGNOSIS — H52209 Unspecified astigmatism, unspecified eye: Secondary | ICD-10-CM | POA: Diagnosis not present

## 2012-02-14 DIAGNOSIS — H2510 Age-related nuclear cataract, unspecified eye: Secondary | ICD-10-CM | POA: Diagnosis not present

## 2012-03-07 DIAGNOSIS — H18839 Recurrent erosion of cornea, unspecified eye: Secondary | ICD-10-CM | POA: Diagnosis not present

## 2012-03-14 DIAGNOSIS — H18839 Recurrent erosion of cornea, unspecified eye: Secondary | ICD-10-CM | POA: Diagnosis not present

## 2012-04-09 DIAGNOSIS — J329 Chronic sinusitis, unspecified: Secondary | ICD-10-CM | POA: Diagnosis not present

## 2012-04-23 DIAGNOSIS — B373 Candidiasis of vulva and vagina: Secondary | ICD-10-CM | POA: Diagnosis not present

## 2012-04-23 DIAGNOSIS — R350 Frequency of micturition: Secondary | ICD-10-CM | POA: Diagnosis not present

## 2012-05-04 DIAGNOSIS — N952 Postmenopausal atrophic vaginitis: Secondary | ICD-10-CM | POA: Diagnosis not present

## 2012-05-04 DIAGNOSIS — Z23 Encounter for immunization: Secondary | ICD-10-CM | POA: Diagnosis not present

## 2012-05-04 DIAGNOSIS — N390 Urinary tract infection, site not specified: Secondary | ICD-10-CM | POA: Diagnosis not present

## 2012-05-04 DIAGNOSIS — B373 Candidiasis of vulva and vagina: Secondary | ICD-10-CM | POA: Diagnosis not present

## 2012-07-03 ENCOUNTER — Other Ambulatory Visit: Payer: Self-pay | Admitting: Obstetrics & Gynecology

## 2012-07-03 DIAGNOSIS — Z1231 Encounter for screening mammogram for malignant neoplasm of breast: Secondary | ICD-10-CM

## 2012-07-21 ENCOUNTER — Telehealth: Payer: Self-pay | Admitting: Oncology

## 2012-07-21 NOTE — Telephone Encounter (Signed)
S/W pt in re r/s appt w/Lisa @ 9:15.  Calendar mailed.  [place]/Lisa

## 2012-08-04 ENCOUNTER — Encounter: Payer: Self-pay | Admitting: *Deleted

## 2012-08-07 ENCOUNTER — Ambulatory Visit
Admission: RE | Admit: 2012-08-07 | Discharge: 2012-08-07 | Disposition: A | Discharge status: Home / Self Care | Payer: Medicare Other | Source: Ambulatory Visit | Attending: Obstetrics & Gynecology | Admitting: Obstetrics & Gynecology

## 2012-08-07 DIAGNOSIS — Z1231 Encounter for screening mammogram for malignant neoplasm of breast: Secondary | ICD-10-CM

## 2012-08-17 ENCOUNTER — Telehealth: Payer: Self-pay | Admitting: Oncology

## 2012-08-17 NOTE — Telephone Encounter (Signed)
s/w husband and advised on 1.22.14 appt moved to 1.29.14 .Marland Kitchen...ok

## 2012-08-22 ENCOUNTER — Ambulatory Visit: Payer: Medicare Other | Admitting: Nurse Practitioner

## 2012-08-22 ENCOUNTER — Other Ambulatory Visit: Payer: Medicare Other | Admitting: Lab

## 2012-08-28 ENCOUNTER — Other Ambulatory Visit: Payer: Self-pay | Admitting: Medical Oncology

## 2012-08-28 DIAGNOSIS — C9110 Chronic lymphocytic leukemia of B-cell type not having achieved remission: Secondary | ICD-10-CM

## 2012-08-29 ENCOUNTER — Other Ambulatory Visit (HOSPITAL_BASED_OUTPATIENT_CLINIC_OR_DEPARTMENT_OTHER): Payer: Medicare Other | Admitting: Lab

## 2012-08-29 ENCOUNTER — Telehealth: Payer: Self-pay | Admitting: Oncology

## 2012-08-29 ENCOUNTER — Ambulatory Visit (HOSPITAL_BASED_OUTPATIENT_CLINIC_OR_DEPARTMENT_OTHER): Payer: Medicare Other | Admitting: Nurse Practitioner

## 2012-08-29 VITALS — BP 118/70 | HR 83 | Temp 97.4°F | Resp 20 | Ht 65.0 in | Wt 140.7 lb

## 2012-08-29 DIAGNOSIS — C9110 Chronic lymphocytic leukemia of B-cell type not having achieved remission: Secondary | ICD-10-CM

## 2012-08-29 LAB — CBC WITH DIFFERENTIAL/PLATELET
BASO%: 0.3 % (ref 0.0–2.0)
Basophils Absolute: 0.1 10*3/uL (ref 0.0–0.1)
EOS%: 0.9 % (ref 0.0–7.0)
Eosinophils Absolute: 0.2 10*3/uL (ref 0.0–0.5)
HCT: 37 % (ref 34.8–46.6)
HGB: 12.6 g/dL (ref 11.6–15.9)
LYMPH%: 78.7 % — ABNORMAL HIGH (ref 14.0–49.7)
MCH: 28.8 pg (ref 25.1–34.0)
MCHC: 34.2 g/dL (ref 31.5–36.0)
MCV: 84.3 fL (ref 79.5–101.0)
MONO#: 0.5 10*3/uL (ref 0.1–0.9)
MONO%: 2.5 % (ref 0.0–14.0)
NEUT#: 3.5 10*3/uL (ref 1.5–6.5)
NEUT%: 17.6 % — ABNORMAL LOW (ref 38.4–76.8)
Platelets: 227 10*3/uL (ref 145–400)
RBC: 4.38 10*6/uL (ref 3.70–5.45)
RDW: 12.7 % (ref 11.2–14.5)
WBC: 20 10*3/uL — ABNORMAL HIGH (ref 3.9–10.3)
lymph#: 15.7 10*3/uL — ABNORMAL HIGH (ref 0.9–3.3)

## 2012-08-29 LAB — COMPREHENSIVE METABOLIC PANEL (CC13)
ALT: 17 U/L (ref 0–55)
AST: 14 U/L (ref 5–34)
Albumin: 4 g/dL (ref 3.5–5.0)
Alkaline Phosphatase: 111 U/L (ref 40–150)
BUN: 16.4 mg/dL (ref 7.0–26.0)
CO2: 27 mEq/L (ref 22–29)
Calcium: 10.3 mg/dL (ref 8.4–10.4)
Chloride: 102 mEq/L (ref 98–107)
Creatinine: 0.7 mg/dL (ref 0.6–1.1)
Glucose: 100 mg/dl — ABNORMAL HIGH (ref 70–99)
Potassium: 3.8 mEq/L (ref 3.5–5.1)
Sodium: 139 mEq/L (ref 136–145)
Total Bilirubin: 0.67 mg/dL (ref 0.20–1.20)
Total Protein: 6.9 g/dL (ref 6.4–8.3)

## 2012-08-29 LAB — TECHNOLOGIST REVIEW

## 2012-08-29 LAB — LACTATE DEHYDROGENASE (CC13): LDH: 162 U/L (ref 125–245)

## 2012-08-29 MED ORDER — VALACYCLOVIR HCL 1 G PO TABS: 1000.0000 mg | ORAL_TABLET | Freq: Three times a day (TID) | ORAL | Status: DC

## 2012-08-29 NOTE — Telephone Encounter (Signed)
gv and printed appt schedule for pt for July and Jan 2015 °

## 2012-08-29 NOTE — Progress Notes (Signed)
OFFICE PROGRESS NOTE  Interval history:  [name] is a 67 year old woman with stage 0 CLL. Initial diagnosis dates to 2005. She was seen by Dr. Dalene Carrow in an initial visit on 02/24/2004 for evaluation of lymphocytosis. CBC at that time showed a hemoglobin of 13.2, hematocrit 39.8, total white count 19.1 with 76% lymphocytes and platelets 322,000. Flow cytometry on peripheral blood on 02/24/2004 showed a major B-cell population expressed in pan B cell antigens including CD20 and CD23. This was associated with compression of CD5 and CD20. No CD10 expression was identified. The B-cell population showed dim/negative staining for surface immunoglobulins. The immunophenotypic pattern was characteristic of chronic lymphocytic leukemia. Counts have remained stable since initial diagnosis.  She is seen today to establish care with Dr. Cyndie Chime.  [name] reports that she feels well. No interim illnesses or infections. No fevers or sweats. She has a good appetite and good energy level. Her weight is stable. Aside from periodic vaginal yeast infections and urinary tract infections no significant recurrent infections. She has had a pruritic rash at the right upper abdomen for several weeks. She is applying hydrocortisone cream. No unusual headaches. No vision change. She denies shortness of breath. She periodically has a cough which she associates with allergies. No chest pain. No leg swelling or calf pain. No GI or GU complaints.  She has hypertension and currently takes hydrochlorothiazide. She has a history of precancerous colon polyps and has routine colonoscopies. She has seasonal allergies. She has cataracts.  She is up-to-date on the influenza vaccine. Prior to the influenza vaccine she premedicate with an antihistamine due to a prior reaction. She typically notes significant redness at the injection site. She thinks she may have had the pneumococcal vaccine 6 years ago. She will confirm with her  primary care physician.  She is allergic to IV contrast.  Her mother died at age 35 with colon cancer. Her father is deceased. He had a history of heart disease, diabetes and strokes. She has 2 brothers and 2 sisters. All of her siblings have gout. One sister also is obese and has hypertension.  She lives in [place]. She moved here from [place] in 1977. She is married. She has 3 living children, a son age 50, son age 52 and a daughter age 93. All are reported to be in good health. A daughter died soon after birth with anencephaly. She has 8 grandchildren. She works in Biomedical engineer. No tobacco use. No alcohol use. She has never had a blood transfusion.   Objective: Blood pressure 118/70, pulse 83, temperature 97.4 F (36.3 C), resp. rate 20, height 5\' 5"  (1.651 m), weight 140 lb 11.2 oz (63.821 kg).  Oropharynx is without thrush or ulceration. No palpable cervical, supraclavicular, axillary or inguinal lymph nodes. Lungs are clear. No wheezes or rales. Regular cardiac rhythm. Abdomen is soft and nontender. No hepatomegaly. No splenomegaly. Extremities are without edema. Calves are soft and nontender. Linear mildly erythematous rash with tiny pustule like lesions at the right upper abdomen.   Lab Results: Lab Results  Component Value Date   WBC 20.0* 08/29/2012   HGB 12.6 08/29/2012   HCT 37.0 08/29/2012   MCV 84.3 08/29/2012   PLT 227 08/29/2012    Chemistry:    Chemistry      Component Value Date/Time   NA 139 08/29/2012 0934   NA 140 08/24/2011 0921   K 3.8 08/29/2012 0934   K 3.9 08/24/2011 0921   CL 102 08/29/2012 0934  CL 104 08/24/2011 0921   CO2 27 08/29/2012 0934   CO2 24 08/24/2011 0921   BUN 16.4 08/29/2012 0934   BUN 15 08/24/2011 0921   CREATININE 0.7 08/29/2012 0934   CREATININE 0.53 08/24/2011 0921      Component Value Date/Time   CALCIUM 10.3 08/29/2012 0934   CALCIUM 9.9 08/24/2011 0921   ALKPHOS 111 08/29/2012 0934   ALKPHOS 100 08/24/2011 0921   AST 14  08/29/2012 0934   AST 20 08/24/2011 0921   ALT 17 08/29/2012 0934   ALT 20 08/24/2011 0921   BILITOT 0.67 08/29/2012 0934   BILITOT 0.5 08/24/2011 1610       Studies/Results: Mm Digital Screening  08/08/2012  *RADIOLOGY REPORT*  Clinical Data: Screening.  DIGITAL BILATERAL SCREENING MAMMOGRAM WITH CAD DIGITAL BREAST TOMOSYNTHESIS  Digital breast tomosynthesis images are acquired in two projections.  These images are reviewed in combination with the digital mammogram, confirming the findings below.  Comparison:  Previous exams.  FINDINGS:  ACR Breast Density Category 3: The breast tissue is heterogeneously dense.  No suspicious masses, architectural distortion, or calcifications are present.  Images were processed with CAD.  IMPRESSION: No mammographic evidence of malignancy.  A result letter of this screening mammogram will be mailed directly to the patient.  RECOMMENDATION: Screening mammogram in one year. (Code:SM-B-01Y)  BI-RADS CATEGORY 1:  Negative.   Original Report Authenticated By: Britta Mccreedy, M.D.     Medications: I have reviewed the patient's current medications.  Assessment/Plan:  1. CLL, stage 0, initially diagnosed July 2005. She has never required treatment. Counts have remained stable. She has no adenopathy and no splenomegaly. 2. Hypertension. 3. Rash right upper abdomen. Question zoster. She will complete a seven-day course of Valtrex 1000 mg 3 times daily. If the rash resolves with Valtrex she will contact the office and we will consider Valtrex prophylaxis. 4. History of colon polyps and family history of colon cancer (mother). She is up-to-date on her colonoscopy.  Disposition-Ms. Oberle appears well. Counts remain stable. She will return for a lab visit in 6 months and a followup visit in one year. She is aware that she is at increased risk for infection and should seek evaluation for signs of infection. She understands that she should remain up-to-date on the influenza and  pneumococcal vaccines. We recommend the pneumococcal vaccine every 5 years.   She will contact the office prior to her next visit with any problems.  Patient seen with Dr. Cyndie Chime.  [name] Cobb ANP/GNP-BC

## 2012-08-30 ENCOUNTER — Telehealth: Payer: Self-pay | Admitting: *Deleted

## 2012-08-30 ENCOUNTER — Other Ambulatory Visit: Payer: Self-pay | Admitting: *Deleted

## 2012-08-30 NOTE — Telephone Encounter (Signed)
Message left on pt's identified vm that she needs her pneumonia vaccine in Feb 2014 & can call back to r/s or if she gets it somewhere else to let us know when she gets it.

## 2012-08-30 NOTE — Telephone Encounter (Signed)
Received call from pt stating that she was seen yest & doctor wanted to know is she had ever received the pneumonia vaccine.  She thinks she had it when Dr. Dalene Carrow was seeing her.  Found information in old computer system-[employer]os[employer] that this was given Feb 2009 & reported to pt via her identified mobile vm.

## 2012-09-15 ENCOUNTER — Other Ambulatory Visit: Payer: Self-pay

## 2012-11-09 ENCOUNTER — Encounter: Payer: Self-pay | Admitting: Certified Nurse Midwife

## 2012-11-12 ENCOUNTER — Encounter: Payer: Self-pay | Admitting: Certified Nurse Midwife

## 2012-11-12 ENCOUNTER — Ambulatory Visit (INDEPENDENT_AMBULATORY_CARE_PROVIDER_SITE_OTHER): Payer: Medicare Other | Admitting: Certified Nurse Midwife

## 2012-11-12 VITALS — BP 110/62 | Ht 65.25 in | Wt 141.0 lb

## 2012-11-12 DIAGNOSIS — Z01419 Encounter for gynecological examination (general) (routine) without abnormal findings: Secondary | ICD-10-CM

## 2012-11-12 DIAGNOSIS — C9110 Chronic lymphocytic leukemia of B-cell type not having achieved remission: Secondary | ICD-10-CM | POA: Diagnosis not present

## 2012-11-12 NOTE — Progress Notes (Signed)
67 y.o. MarriedCaucasian female   Audrey Sweeney here for annual exam.Menopausal denies vaginal bleeding.  Vaginal dryness so much better with Vagifem use and Olive Oil. Hypertension stable on medication.  Sees primary care yearly.  Spouse recent hip replacement, with patient as care provider.  Going well.  No heatlh issues.  Plans PCP change this year. CLL stable with WBC level staying same.   Patient's last menstrual period was 08/01/2006.          Sexually active: yes  The current method of family planning is tubal ligation.    Exercising: yes  stretching Last mammogram: 08-07-12 Last pap: 11-07-11 neg Last BMD: 07-09-10 Alcohol: none Tobacco: none Colonoscopy: 2010   Health Maintenance  Topic Date Due  . Colonoscopy  06/19/1996  . Zostavax  06/19/2006  . Pneumococcal Polysaccharide Vaccine Age 7 And Over  06/20/2011  . Influenza Vaccine  04/01/2013  . Mammogram  08/07/2014  . Tetanus/tdap  10/25/2016    Family History  Problem Relation Age of Onset  . Colon cancer Mother   . Stroke Father   . Diabetes Father   . Heart disease Father   . Hypertension Father   . Heart disease Maternal Grandmother   . Diabetes Maternal Grandmother   . Cancer Paternal Grandmother     There is no problem list on file for this patient.   Past Medical History  Diagnosis Date  . Arthritis   . CLL (chronic lymphocytic leukemia)     Rai stage 0.  . IBS (irritable bowel syndrome)   . Rheumatoid arthritis   . Hypertension   . Depression   . Osteopenia     Past Surgical History  Procedure Laterality Date  . Colonoscopy w/ polypectomy      Pre-cancerous polyps removed.  . Endometrial biopsy      8/98  . Tubal ligation      Allergies: Contrast media and Provera  Current Outpatient Prescriptions  Medication Sig Dispense Refill  . cetirizine (ZYRTEC) 10 MG tablet Take 10 mg by mouth as needed.      . DiphenhydrAMINE HCl (BENADRYL PO) Take by mouth as needed.      . Estradiol (VAGIFEM) 10 MCG  TABS Place vaginally 2 (two) times a week.      . hydrochlorothiazide (HYDRODIURIL) 12.5 MG tablet Take 12.5 mg by mouth daily.      Marland Kitchen ibuprofen (ADVIL,MOTRIN) 200 MG tablet Take 200 mg by mouth as needed.      . Multiple Vitamin (MULTIVITAMIN) tablet Take 1 tablet by mouth daily.       . Probiotic Product (PROBIOTIC FORMULA PO) Take 1 tablet by mouth daily.      . pseudoephedrine (SUDAFED) 30 MG tablet Take 30 mg by mouth as needed.      . Vitamins C E (CRANBERRY CONCENTRATE PO) Take 1 tablet by mouth daily.       No current facility-administered medications for this visit.    ROS: A comprehensive review of systems was negative.  Exam:    BP 110/62  Ht 5' 5.25" (1.657 m)  Wt 141 lb (63.957 kg)  BMI 23.29 kg/m2  LMP 08/01/2006 Weight change: @WEIGHTCHANGE @ Last 3 height recordings:  Ht Readings from Last 3 Encounters:  11/12/12 5' 5.25" (1.657 m)  08/29/12 5\' 5"  (1.651 m)  08/24/11 5\' 5"  (1.651 m)   General appearance: alert, cooperative and appears stated age Head: Normocephalic, without obvious abnormality, atraumatic Neck: no adenopathy, supple, symmetrical, trachea midline and thyroid not enlarged,  symmetric, no tenderness/mass/nodules Lungs: clear to auscultation bilaterally Breasts: normal appearance, no masses or tenderness, No nipple retraction or dimpling Heart: regular rate and rhythm Abdomen: soft, non-tender; bowel sounds normal; no masses,  no organomegaly Extremities: extremities normal, atraumatic, no cyanosis or edema Skin: Skin color, texture, turgor normal. No rashes or lesions Lymph nodes: Cervical, supraclavicular, and axillary nodes normal. no inguinal nodes palpated Neurologic: Alert and oriented X 3, normal strength and tone. Normal symmetric reflexes. Normal coordination and gait   Pelvic: External genitalia:  no lesions and atrophic appearance              Urethra: normal appearing urethra with no masses, tenderness or lesions              Bartholins  and Skenes: Bartholin's, Urethra, Skene's normal                 Vagina: normal appearing vagina with normal color and discharge, no lesions, atrophic              Cervix: normal appearance              Pap taken: no        Bimanual Exam:  Uterus:  uterus is normal size, shape, consistency and nontender                                      Adnexa:    normal adnexa in size, nontender and no masses                                      Rectovaginal: Confirms                                      Anus:  normal sphincter tone, no lesions  A:Well woman exam Menopausal no HRT Atrophic Vaginitis uses Vagifem and Olive oil with good success History of CLL stage 0 stable   Hypertension on stable Medication with MD follow up     P: Reviewed health and wellness pertinent to exam Mammogram yearly, Pap as indicated Will advise when Rx needed, continue as instructed Continue MD follow-up as indicated  return annually or prn      An After Visit Summary was printed and given to the patient.  Reviewed, TL

## 2012-11-12 NOTE — Patient Instructions (Signed)

## 2012-12-10 ENCOUNTER — Ambulatory Visit (INDEPENDENT_AMBULATORY_CARE_PROVIDER_SITE_OTHER): Payer: Medicare Other | Admitting: Certified Nurse Midwife

## 2012-12-10 ENCOUNTER — Encounter: Payer: Self-pay | Admitting: Certified Nurse Midwife

## 2012-12-10 VITALS — BP 100/60 | Temp 98.3°F

## 2012-12-10 DIAGNOSIS — B373 Candidiasis of vulva and vagina: Secondary | ICD-10-CM

## 2012-12-10 DIAGNOSIS — N390 Urinary tract infection, site not specified: Secondary | ICD-10-CM | POA: Diagnosis not present

## 2012-12-10 LAB — POCT URINALYSIS DIPSTICK
Bilirubin, UA: NEGATIVE
Blood, UA: NEGATIVE
Glucose, UA: NEGATIVE
Ketones, UA: NEGATIVE
Nitrite, UA: NEGATIVE
Protein, UA: NEGATIVE
Urobilinogen, UA: NEGATIVE
pH, UA: 5

## 2012-12-10 MED ORDER — FLUCONAZOLE 150 MG PO TABS: 150.0000 mg | ORAL_TABLET | Freq: Once | ORAL | Status: DC

## 2012-12-10 MED ORDER — CIPROFLOXACIN HCL 500 MG PO TABS: 500.0000 mg | ORAL_TABLET | Freq: Two times a day (BID) | ORAL | Status: DC

## 2012-12-10 NOTE — Progress Notes (Signed)
S:  67 y.o.Married white  female presents with UTI symptoms of aching, chills once,, some frequency, urgency, burning with urination. Also noticed some white discharge, with no itching.  No new personal products Onset of symptoms for7 days. Pertinent negatives include The patient is having no constitutional symptoms, denying fever, chills, anorexia, or weight loss..  Symptoms related to post coital No Current method of birth control menopausal.   . Last UTI documented  one year ago.  Uses Vagifem for atrophic vaginitis..  ROS: fatigued and" not myself"  O alert, oriented to person, place, and time   healthy, well developed and well nourished  Skin: warm and dry  CVAT negative bilateral  Supra pubic tenderness noted Pelvic exam: External genital area: normal  Urethra/urethral meatus/bladder all tender  Vagina; white discharge noted, no odor, atrophic appearance, wet prep taken  Cervix: non tender, normal  Uterus: normal, non tender, anteflexed   Adnexa: normal, non tender, no masses      Diagnostic Test:    Urinalysis wbc- 5-10  Wet prep: yeast  A:UTI Yeast vaginitis Atrophic Vaginitis on vagifem uses as directed  P:Reviewed findings Rx Cipro see order, Increase water intake and decrease tea.  Warning signs and symptoms of UTI given. Rx Diflucan see order   TOC if Urine Culture is positive Rv prn  Patient is given AVS       Reviewed, TL

## 2012-12-12 LAB — URINE CULTURE
Colony Count: NO GROWTH
Organism ID, Bacteria: NO GROWTH

## 2013-01-30 DIAGNOSIS — J45909 Unspecified asthma, uncomplicated: Secondary | ICD-10-CM | POA: Diagnosis not present

## 2013-01-30 DIAGNOSIS — J329 Chronic sinusitis, unspecified: Secondary | ICD-10-CM | POA: Diagnosis not present

## 2013-02-18 ENCOUNTER — Telehealth: Payer: Self-pay | Admitting: Certified Nurse Midwife

## 2013-02-18 NOTE — Telephone Encounter (Signed)
Pt is requesting a Rx for Vagifem. Pt's last aex was on 11/12/2012. I don't see wear a Rx for Vagifem was given. Please advise.

## 2013-02-18 NOTE — Telephone Encounter (Signed)
Patient needs her prescription refill call in to. vagfem 10 mcg #24   [facility] [address] [phone]

## 2013-02-18 NOTE — Telephone Encounter (Signed)
Patient ws to call when out. OK to refill x 1 year

## 2013-02-19 ENCOUNTER — Telehealth: Payer: Self-pay | Admitting: *Deleted

## 2013-02-19 MED ORDER — ESTRADIOL 10 MCG VA TABS: 10.0000 ug | ORAL_TABLET | VAGINAL | Status: DC

## 2013-02-19 NOTE — Telephone Encounter (Signed)
Rx for Vagifem 10 mcg tabs (1 vaginally 2x weekly) #8/12 refills sent to pt's pharmacy. pt is aware.

## 2013-02-20 DIAGNOSIS — H2510 Age-related nuclear cataract, unspecified eye: Secondary | ICD-10-CM | POA: Diagnosis not present

## 2013-02-20 DIAGNOSIS — H52209 Unspecified astigmatism, unspecified eye: Secondary | ICD-10-CM | POA: Diagnosis not present

## 2013-02-25 ENCOUNTER — Other Ambulatory Visit (HOSPITAL_BASED_OUTPATIENT_CLINIC_OR_DEPARTMENT_OTHER): Payer: Medicare Other | Admitting: Lab

## 2013-02-25 DIAGNOSIS — C9110 Chronic lymphocytic leukemia of B-cell type not having achieved remission: Secondary | ICD-10-CM

## 2013-02-25 LAB — CBC WITH DIFFERENTIAL/PLATELET
BASO%: 0.3 % (ref 0.0–2.0)
Basophils Absolute: 0.1 10*3/uL (ref 0.0–0.1)
EOS%: 1.5 % (ref 0.0–7.0)
Eosinophils Absolute: 0.3 10*3/uL (ref 0.0–0.5)
HCT: 35.6 % (ref 34.8–46.6)
HGB: 11.8 g/dL (ref 11.6–15.9)
LYMPH%: 76.6 % — ABNORMAL HIGH (ref 14.0–49.7)
MCH: 28.4 pg (ref 25.1–34.0)
MCHC: 33.1 g/dL (ref 31.5–36.0)
MCV: 85.6 fL (ref 79.5–101.0)
MONO#: 0.4 10*3/uL (ref 0.1–0.9)
MONO%: 2.2 % (ref 0.0–14.0)
NEUT#: 3.1 10*3/uL (ref 1.5–6.5)
NEUT%: 19.4 % — ABNORMAL LOW (ref 38.4–76.8)
Platelets: 225 10*3/uL (ref 145–400)
RBC: 4.16 10*6/uL (ref 3.70–5.45)
RDW: 12.8 % (ref 11.2–14.5)
WBC: 16.1 10*3/uL — ABNORMAL HIGH (ref 3.9–10.3)
lymph#: 12.4 10*3/uL — ABNORMAL HIGH (ref 0.9–3.3)

## 2013-02-25 LAB — TECHNOLOGIST REVIEW

## 2013-03-06 ENCOUNTER — Other Ambulatory Visit: Payer: Self-pay

## 2013-03-18 DIAGNOSIS — L02419 Cutaneous abscess of limb, unspecified: Secondary | ICD-10-CM | POA: Diagnosis not present

## 2013-05-03 ENCOUNTER — Ambulatory Visit (INDEPENDENT_AMBULATORY_CARE_PROVIDER_SITE_OTHER): Payer: Medicare Other | Admitting: Certified Nurse Midwife

## 2013-05-03 ENCOUNTER — Encounter: Payer: Self-pay | Admitting: Certified Nurse Midwife

## 2013-05-03 ENCOUNTER — Telehealth: Payer: Self-pay | Admitting: Certified Nurse Midwife

## 2013-05-03 VITALS — BP 102/62 | HR 64 | Temp 98.4°F | Resp 16 | Ht 65.25 in | Wt 142.0 lb

## 2013-05-03 DIAGNOSIS — N390 Urinary tract infection, site not specified: Secondary | ICD-10-CM

## 2013-05-03 LAB — POCT URINALYSIS DIPSTICK
Bilirubin, UA: NEGATIVE
Glucose, UA: NEGATIVE
Ketones, UA: NEGATIVE
Nitrite, UA: NEGATIVE
Protein, UA: NEGATIVE
Urobilinogen, UA: NEGATIVE
pH, UA: 5

## 2013-05-03 MED ORDER — FLUCONAZOLE 150 MG PO TABS: 150.0000 mg | ORAL_TABLET | Freq: Once | ORAL | Status: DC

## 2013-05-03 MED ORDER — CIPROFLOXACIN HCL 500 MG PO TABS: 500.0000 mg | ORAL_TABLET | Freq: Two times a day (BID) | ORAL | Status: DC

## 2013-05-03 NOTE — Telephone Encounter (Signed)
Spoke with patient. Yesterday noted cloudy urine and today having dysuria, chills and fatigue. No fevers or flank pain. Spoke with Verner Chol CNM and appointment scheduled, patient agreeable.

## 2013-05-03 NOTE — Telephone Encounter (Signed)
Patient thinks she may have a bladder infection or vaginal infection. Wants to come in today but i dont have appt available for debbie.

## 2013-05-03 NOTE — Progress Notes (Signed)
67 y.o.MarriedCaucasian female G3P3 with a 2 day(s) history of the following:burning and urinary symptoms of cloudy urine, dysuria, flank pain on the left, urinary frequency, urinary incontinence and urinary urgency Sexually active: yes Last sexual activity:4days ago. Pt also reports the following associated symptoms: fatigue. Patient has not tried over the counter treatment. History of E.Coli UTI. Denies vaginal bleeding or dryness. Using Vagifem and Olive Oil with good response. No other health issues. Will be a grandmother again # 9!   O: Healthy female WDWN Skin: warm and dry Abdomen: positive suprapubic CVAT Right   Exam:  [id number]:[id number], Bartholin's, Urethra, Skene's normal, urethra, urethral meatus, and bladder all tender                Vag:no lesions, moist normal appearance                Cx:  normal appearance and non tender                Uterus:anteverted, non-tender, normal shape and consistency                Adnexa: normal adnexa and no mass, fullness, tenderness  A;UTI Menopausal no HRT Atrophic Vaginits with Vagifem use Yeast vaginitis with Cipro use history  P:Reviewed findings. Warning signs of UTI given. Rx Cipro see order Rx Diflucan see order Lab: Urine micro/culture. Increase water daily.Avoid soda, coffee and tea.  Rv 2 weeks if culture positive, and prn

## 2013-05-04 LAB — URINALYSIS, MICROSCOPIC ONLY
Bacteria, UA: NONE SEEN
Casts: NONE SEEN
Crystals: NONE SEEN
Squamous Epithelial / LPF: NONE SEEN
WBC, UA: >50 WBC/hpf — AB (ref <3)

## 2013-05-04 LAB — URINE CULTURE: Colony Count: 9000

## 2013-05-07 NOTE — Progress Notes (Signed)
Note reviewed, agree with plan.  Lundon Rosier, MD  

## 2013-05-20 ENCOUNTER — Ambulatory Visit (INDEPENDENT_AMBULATORY_CARE_PROVIDER_SITE_OTHER): Payer: Medicare Other | Admitting: Certified Nurse Midwife

## 2013-05-20 VITALS — BP 112/62 | HR 68 | Resp 16 | Ht 65.25 in | Wt 143.0 lb

## 2013-05-20 DIAGNOSIS — B373 Candidiasis of vulva and vagina: Secondary | ICD-10-CM | POA: Diagnosis not present

## 2013-05-20 DIAGNOSIS — N390 Urinary tract infection, site not specified: Secondary | ICD-10-CM | POA: Diagnosis not present

## 2013-05-20 LAB — POCT URINALYSIS DIPSTICK
Bilirubin, UA: NEGATIVE
Blood, UA: NEGATIVE
Glucose, UA: NEGATIVE
Ketones, UA: NEGATIVE
Nitrite, UA: NEGATIVE
Protein, UA: NEGATIVE
Urobilinogen, UA: NEGATIVE
pH, UA: 5

## 2013-05-20 MED ORDER — NYSTATIN-TRIAMCINOLONE 100000-0.1 UNIT/GM-% EX OINT: TOPICAL_OINTMENT | Freq: Two times a day (BID) | CUTANEOUS | Status: DC

## 2013-05-20 NOTE — Progress Notes (Signed)
67 y.o.MarriedCaucasian female G3P3 with a 5 day(s) history of the following:vulvar itching and slight redness. No new personal products. Patient took Diflucan as prescribed. Denies increase in vaginal discharge or odor. Patient also here for follow up of UTI  Treated with Cipro on 05/07/2013. Completed all medication as prescribed. Denies urinary frequency, urgency or pain. ALso noted swelling of left leg and tenderness. Noted firm discolored area on left lower leg. Please check. Patient has been cleaning and moving furniture.  O: Healthy female, WDWN Affect: normal Skin: warm and dry CVAT negative Abdomen: negative suprapubic pain Left and Right legs: normal and equal pulses, no edema noted, small 4 cm bruise appearance noted on left leg, soft, non tender, right leg same area small 1 cm bruise appearance, non tender    Exam:  [id number]:[id number], Urethra, Skene's normal,  Bladder non tender, vulva slightly red with exudate, no lesions, wet prep taken                Vag:no lesions, discharge: scant, pH 4.0, wet prep done                Cx:  normal appearance                Uterus:normal size, non-tender, normal shape and consistency                Adnexa: normal adnexa and no mass, fullness, tenderness  Wet Prep shows:vaginal negtive, vulva area positive yeast  Poct urine-wbc-tr  A; UTI resolved Yeast vulvitis Bruising on lower legs due acitivity   P:Reviewed finding of UTI resolved.  Reviewed findings of yeast :Symptomatic local care discussed. Rx Mycolog ointment see order Discussed appearance of ? Bruises, encouraged to follow up with PCP if pain or edema return or change in appearance. Patient agreeable

## 2013-05-21 DIAGNOSIS — Z23 Encounter for immunization: Secondary | ICD-10-CM | POA: Diagnosis not present

## 2013-05-22 DIAGNOSIS — C9110 Chronic lymphocytic leukemia of B-cell type not having achieved remission: Secondary | ICD-10-CM | POA: Diagnosis not present

## 2013-05-22 DIAGNOSIS — Z79899 Other long term (current) drug therapy: Secondary | ICD-10-CM | POA: Diagnosis not present

## 2013-05-22 DIAGNOSIS — R609 Edema, unspecified: Secondary | ICD-10-CM | POA: Diagnosis not present

## 2013-05-22 DIAGNOSIS — E782 Mixed hyperlipidemia: Secondary | ICD-10-CM | POA: Diagnosis not present

## 2013-05-22 NOTE — Progress Notes (Signed)
Note reviewed, agree with plan.  Sheral Pfahler, MD  

## 2013-06-06 ENCOUNTER — Other Ambulatory Visit: Payer: Self-pay

## 2013-07-02 DIAGNOSIS — J069 Acute upper respiratory infection, unspecified: Secondary | ICD-10-CM | POA: Diagnosis not present

## 2013-07-02 DIAGNOSIS — J209 Acute bronchitis, unspecified: Secondary | ICD-10-CM | POA: Diagnosis not present

## 2013-07-29 ENCOUNTER — Other Ambulatory Visit: Payer: Self-pay

## 2013-07-29 DIAGNOSIS — Z1231 Encounter for screening mammogram for malignant neoplasm of breast: Secondary | ICD-10-CM

## 2013-08-21 ENCOUNTER — Ambulatory Visit
Admission: RE | Admit: 2013-08-21 | Discharge: 2013-08-21 | Disposition: A | Discharge status: Home / Self Care | Payer: Medicare Other | Source: Ambulatory Visit

## 2013-08-21 DIAGNOSIS — Z1231 Encounter for screening mammogram for malignant neoplasm of breast: Secondary | ICD-10-CM | POA: Diagnosis not present

## 2013-08-27 ENCOUNTER — Ambulatory Visit (HOSPITAL_BASED_OUTPATIENT_CLINIC_OR_DEPARTMENT_OTHER): Payer: Medicare Other | Admitting: Oncology

## 2013-08-27 ENCOUNTER — Other Ambulatory Visit (HOSPITAL_BASED_OUTPATIENT_CLINIC_OR_DEPARTMENT_OTHER): Payer: Medicare Other

## 2013-08-27 VITALS — BP 139/61 | HR 78 | Temp 97.5°F | Resp 18 | Ht 65.0 in | Wt 141.5 lb

## 2013-08-27 DIAGNOSIS — C9110 Chronic lymphocytic leukemia of B-cell type not having achieved remission: Secondary | ICD-10-CM

## 2013-08-27 LAB — COMPREHENSIVE METABOLIC PANEL (CC13)
ALT: 34 U/L (ref 0–55)
AST: 22 U/L (ref 5–34)
Albumin: 4.5 g/dL (ref 3.5–5.0)
Alkaline Phosphatase: 111 U/L (ref 40–150)
Anion Gap: 9 mEq/L (ref 3–11)
BUN: 13.4 mg/dL (ref 7.0–26.0)
CO2: 28 mEq/L (ref 22–29)
Calcium: 10.2 mg/dL (ref 8.4–10.4)
Chloride: 103 mEq/L (ref 98–109)
Creatinine: 0.6 mg/dL (ref 0.6–1.1)
Glucose: 102 mg/dl (ref 70–140)
Potassium: 4.2 mEq/L (ref 3.5–5.1)
Sodium: 141 mEq/L (ref 136–145)
Total Bilirubin: 0.6 mg/dL (ref 0.20–1.20)
Total Protein: 7.1 g/dL (ref 6.4–8.3)

## 2013-08-27 LAB — CBC WITH DIFFERENTIAL/PLATELET
BASO%: 0.4 % (ref 0.0–2.0)
Basophils Absolute: 0.1 10*3/uL (ref 0.0–0.1)
EOS%: 1.4 % (ref 0.0–7.0)
Eosinophils Absolute: 0.3 10*3/uL (ref 0.0–0.5)
HCT: 37.9 % (ref 34.8–46.6)
HGB: 12.8 g/dL (ref 11.6–15.9)
LYMPH%: 74.6 % — ABNORMAL HIGH (ref 14.0–49.7)
MCH: 29 pg (ref 25.1–34.0)
MCHC: 33.7 g/dL (ref 31.5–36.0)
MCV: 86.1 fL (ref 79.5–101.0)
MONO#: 0.5 10*3/uL (ref 0.1–0.9)
MONO%: 2.6 % (ref 0.0–14.0)
NEUT#: 3.9 10*3/uL (ref 1.5–6.5)
NEUT%: 21 % — ABNORMAL LOW (ref 38.4–76.8)
Platelets: 261 10*3/uL (ref 145–400)
RBC: 4.4 10*6/uL (ref 3.70–5.45)
RDW: 13.1 % (ref 11.2–14.5)
WBC: 18.8 10*3/uL — ABNORMAL HIGH (ref 3.9–10.3)
lymph#: 14 10*3/uL — ABNORMAL HIGH (ref 0.9–3.3)

## 2013-08-27 LAB — MORPHOLOGY
PLT EST: ADEQUATE
RBC Comments: NORMAL

## 2013-08-27 LAB — LACTATE DEHYDROGENASE (CC13): LDH: 177 U/L (ref 125–245)

## 2013-08-28 NOTE — Progress Notes (Signed)
Hematology and Oncology Follow Up Visit  Audrey Sweeney [id number] 02/26/1946 68 y.o. 08/28/2013 2:41 PM   Principle Diagnosis: Encounter Diagnosis  Name Primary?  . Chronic lymphatic leukemia Yes     Interim History:   Followup visit for this pleasant 68 year old woman with stage 0 chronic lymphocytic leukemia initially diagnosed in July 2005. She has had stable blood counts with white counts in the range of 16 to 31,000 with average white count running 20,000 with 15-20% neutrophils and 75-80% lymphocytes. She has not required any treatment to date. She has not had any problems with recurrent or severe infection. She is current with her annual flu vaccine. She has had a pneumonia vaccine within the last 5 years. She is showing no evidence of any paraneoplastic problems such as hemolytic anemia. LDH consistently normal. Serum immunoglobulins done back in 2009 showed a mild depression of IgG.  She has had no interim medical problems.  Medications: reviewed  Allergies:  Allergies  Allergen Reactions  . Contrast Media [Iodinated Diagnostic Agents] Shortness Of Breath    IV Contrast allergy.   Pt has SOB even with premedication.  . Provera [Medroxyprogesterone Acetate]     Headache & depression    Review of Systems: Hematology:  No bleeding or bruising ENT ROS: No sore throat Breast ROS:  Respiratory ROS: No cough or dyspnea Cardiovascular ROS:  No chest pain or palpitations Gastrointestinal ROS:   No change in bowel habit Genito-Urinary ROS: No urinary tract symptoms Musculoskeletal ROS: No muscle bone or joint pain Neurological ROS: No headache or change in vision Dermatological ROS: No rash or ecchymosis Remaining ROS negative:   Physical Exam: Blood pressure 139/61, pulse 78, temperature 97.5 F (36.4 C), temperature source Oral, resp. rate 18, height 5\' 5"  (1.651 m), weight 141 lb 8 oz (64.184 kg), last menstrual period 08/01/2006. Wt Readings from Last 3 Encounters:   08/27/13 141 lb 8 oz (64.184 kg)  05/20/13 143 lb (64.864 kg)  05/03/13 142 lb (64.411 kg)     General appearance: Well nourished Caucasian woman HENNT: Pharynx no erythema, exudate, mass, or ulcer. No thyromegaly or thyroid nodules Lymph nodes: No cervical, supraclavicular, or axillary lymphadenopathy Breasts:  Lungs: Clear to auscultation, resonant to percussion throughout Heart: Regular rhythm, no murmur, no gallop, no rub, no click, no edema Abdomen: Soft, nontender, normal bowel sounds, no mass, no organomegaly Extremities: No edema, no calf tenderness Musculoskeletal: no joint deformities GU:  Vascular: Carotid pulses 2+, no bruits,  Neurologic: Alert, oriented, PERRLA, , cranial nerves grossly normal, motor strength 5 over 5, reflexes 1+ symmetric, upper body coordination normal, gait normal, Skin: No rash or ecchymosis  Lab Results: CBC W/Diff    Component Value Date/Time   WBC 18.8* 08/27/2013 1035   RBC 4.40 08/27/2013 1035   HGB 12.8 08/27/2013 1035   HCT 37.9 08/27/2013 1035   PLT 261 08/27/2013 1035   MCV 86.1 08/27/2013 1035   MCH 29.0 08/27/2013 1035   MCH 29.8 08/11/2010 0857   MCHC 33.7 08/27/2013 1035   RDW 13.1 08/27/2013 1035   LYMPHSABS 14.0* 08/27/2013 1035   MONOABS 0.5 08/27/2013 1035   EOSABS 0.3 08/27/2013 1035   BASOSABS 0.1 08/27/2013 1035     Chemistry      Component Value Date/Time   NA 141 08/27/2013 1035   NA 140 08/24/2011 0921   K 4.2 08/27/2013 1035   K 3.9 08/24/2011 0921   CL 102 08/29/2012 0934   CL 104 08/24/2011 2841  CO2 28 08/27/2013 1035   CO2 24 08/24/2011 0921   BUN 13.4 08/27/2013 1035   BUN 15 08/24/2011 0921   CREATININE 0.6 08/27/2013 1035   CREATININE 0.53 08/24/2011 0921      Component Value Date/Time   CALCIUM 10.2 08/27/2013 1035   CALCIUM 9.9 08/24/2011 0921   ALKPHOS 111 08/27/2013 1035   ALKPHOS 100 08/24/2011 0921   AST 22 08/27/2013 1035   AST 20 08/24/2011 0921   ALT 34 08/27/2013 1035   ALT 20 08/24/2011 0921   BILITOT 0.60  08/27/2013 1035   BILITOT 0.5 08/24/2011 2202       Radiological Studies: Mm Screening Breast Tomo Bilateral  08/21/2013   CLINICAL DATA:  Screening.  EXAM: DIGITAL SCREENING BILATERAL MAMMOGRAM WITH 3D TOMO WITH CAD  COMPARISON:  Previous exam(s).  ACR Breast Density Category c: The breast tissue is heterogeneously dense, which may obscure small masses.  FINDINGS: There are no findings suspicious for malignancy. Images were processed with CAD.  IMPRESSION: No mammographic evidence of malignancy. A result letter of this screening mammogram will be mailed directly to the patient.  RECOMMENDATION: Screening mammogram in one year. (Code:SM-B-01Y)  BI-RADS CATEGORY  1: Negative.   Electronically Signed   By: Lovey Newcomer M.D.   On: 08/21/2013 11:17    Impression:  Stage 0 chronic lymphocytic leukemia Blood counts have been stable over a 7 year interval of followup. No indication for treatment at this time. We will continue to see her on an annual basis. I will transition her care to Dr. Alvy Bimler.   CC: Patient Care Team: Gerrit Heck, MD as PCP - General (Family Medicine)   Annia Belt, MD 1/28/20152:41 PM

## 2013-08-29 ENCOUNTER — Telehealth: Payer: Self-pay | Admitting: Hematology and Oncology

## 2013-08-29 NOTE — Telephone Encounter (Signed)
s.w. pt husband and advised i would mailed pt appts, avs and letter

## 2013-09-10 ENCOUNTER — Encounter: Payer: Self-pay | Admitting: Obstetrics & Gynecology

## 2013-09-28 ENCOUNTER — Encounter: Payer: Self-pay | Admitting: Oncology

## 2013-11-18 ENCOUNTER — Encounter: Payer: Self-pay | Admitting: Certified Nurse Midwife

## 2013-11-18 ENCOUNTER — Ambulatory Visit (INDEPENDENT_AMBULATORY_CARE_PROVIDER_SITE_OTHER): Payer: Medicare Other | Admitting: Certified Nurse Midwife

## 2013-11-18 VITALS — BP 112/62 | HR 80 | Resp 16 | Ht 65.5 in | Wt 143.0 lb

## 2013-11-18 DIAGNOSIS — R5383 Other fatigue: Secondary | ICD-10-CM

## 2013-11-18 DIAGNOSIS — Z124 Encounter for screening for malignant neoplasm of cervix: Secondary | ICD-10-CM | POA: Diagnosis not present

## 2013-11-18 DIAGNOSIS — Z0000 Encounter for general adult medical examination without abnormal findings: Secondary | ICD-10-CM

## 2013-11-18 DIAGNOSIS — R5381 Other malaise: Secondary | ICD-10-CM

## 2013-11-18 DIAGNOSIS — N390 Urinary tract infection, site not specified: Secondary | ICD-10-CM | POA: Diagnosis not present

## 2013-11-18 DIAGNOSIS — Z01419 Encounter for gynecological examination (general) (routine) without abnormal findings: Secondary | ICD-10-CM | POA: Diagnosis not present

## 2013-11-18 DIAGNOSIS — Z87440 Personal history of urinary (tract) infections: Secondary | ICD-10-CM | POA: Diagnosis not present

## 2013-11-18 LAB — CBC
HCT: 36.7 % (ref 36.0–46.0)
Hemoglobin: 12.5 g/dL (ref 12.0–15.0)
MCH: 28.3 pg (ref 26.0–34.0)
MCHC: 34.1 g/dL (ref 30.0–36.0)
MCV: 83 fL (ref 78.0–100.0)
Platelets: 255 10*3/uL (ref 150–400)
RBC: 4.42 MIL/uL (ref 3.87–5.11)
RDW: 13.9 % (ref 11.5–15.5)
WBC: 16 10*3/uL — ABNORMAL HIGH (ref 4.0–10.5)

## 2013-11-18 LAB — POCT URINALYSIS DIPSTICK
Bilirubin, UA: NEGATIVE
Blood, UA: NEGATIVE
Glucose, UA: NEGATIVE
Ketones, UA: NEGATIVE
Nitrite, UA: NEGATIVE
Protein, UA: NEGATIVE
Urobilinogen, UA: NEGATIVE
pH, UA: 5

## 2013-11-18 NOTE — Patient Instructions (Signed)

## 2013-11-18 NOTE — Progress Notes (Signed)
68 y.o. G3P3 MarriedCaucasianF here for annual exam. Menopausal no HRT. Denies vaginal bleeding. Vagifem working well for vaginal dryness. New grandchild, busy helping family. Has noticed increase fatigue, ? Anemia again.Eating well, but not resting as much. Some urinary urgency, but no frequency, back pain, or fever or chills. No new personal products. Sees PCP for aex and medication management for hypertension. No other health issues today.    Patient's last menstrual period was 08/01/2006.          Sexually active: yes  The current method of family planning is post menopausal status.    Exercising: yes  Home exercise routine includes stretching. Smoker:  no  Health Maintenance: Pap:  2013 History of abnormal Pap:  no MMG:  1/15 negative, density category C Colonoscopy:  2010 negative, due now BMD:   2011 -2.4/-2.2 TDaP:  2008 Screening Labs: Hb today: CBC, Urine today: WBC 1+   reports that she has never smoked. She does not have any smokeless tobacco history on file. She reports that she does not drink alcohol or use illicit drugs.  Past Medical History  Diagnosis Date  . Arthritis   . CLL (chronic lymphocytic leukemia)     Rai stage 0.  . IBS (irritable bowel syndrome)   . Rheumatoid arthritis(714.0)   . Hypertension   . Depression   . Osteopenia     Past Surgical History  Procedure Laterality Date  . Colonoscopy w/ polypectomy      Pre-cancerous polyps removed.  . Endometrial biopsy      8/98  . Tubal ligation    . Cesarean section      Current Outpatient Prescriptions  Medication Sig Dispense Refill  . aspirin 325 MG EC tablet Take 325 mg by mouth as needed for pain (for headache).      . cetirizine (ZYRTEC) 10 MG tablet Take 10 mg by mouth as needed.      Marland Kitchen CRANBERRY PO Take by mouth daily.      . Estradiol (VAGIFEM) 10 MCG TABS vaginal tablet Place 1 tablet (10 mcg total) vaginally 2 (two) times a week.  8 tablet  12  . hydrochlorothiazide (HYDRODIURIL) 12.5 MG  tablet Take 12.5 mg by mouth daily.      Marland Kitchen ibuprofen (ADVIL,MOTRIN) 200 MG tablet Take 200 mg by mouth as needed.      . Multiple Vitamin (MULTIVITAMIN) tablet Take 1 tablet by mouth daily.       Marland Kitchen nystatin-triamcinolone ointment (MYCOLOG) Apply topically 2 (two) times daily. For 5 days  30 g  0  . Probiotic Product (PROBIOTIC FORMULA PO) Take 1 tablet by mouth daily.      . pseudoephedrine (SUDAFED) 30 MG tablet Take 30 mg by mouth as needed.       No current facility-administered medications for this visit.    Family History  Problem Relation Age of Onset  . Colon cancer Mother   . Stroke Father   . Diabetes Father   . Heart disease Father   . Hypertension Father   . Heart disease Maternal Grandmother   . Diabetes Maternal Grandmother   . Cancer Paternal Grandmother     female cancer  . Emphysema Maternal Grandfather   . Breast cancer Cousin     passed away age 57    ROS:  Pertinent items are noted in HPI.  Otherwise, a comprehensive ROS was negative.  Exam:   LMP 08/01/2006  Weight change: @WEIGHTCHANGE @ Height:      Ht  Readings from Last 3 Encounters:  08/27/13 5\' 5"  (1.651 m)  05/20/13 5' 5.25" (1.657 m)  05/03/13 5' 5.25" (1.657 m)    General appearance: alert, cooperative and appears stated age Head: Normocephalic, without obvious abnormality, atraumatic Neck: no adenopathy, supple, symmetrical, trachea midline and thyroid normal to inspection and palpation and non-palpable Lungs: clear to auscultation bilaterally CVAAT negative bilateral Breasts: normal appearance, no masses or tenderness, No nipple retraction or dimpling, No nipple discharge or bleeding, No axillary or supraclavicular adenopathy Heart: regular rate and rhythm Abdomen: soft, non-tender; bowel sounds normal; no masses,  no organomegaly, no suprapubic tenderness Extremities: extremities normal, atraumatic, no cyanosis or edema Skin: Skin color, texture, turgor normal. No rashes or lesions, warm and  dry Lymph nodes: Cervical, supraclavicular, and axillary nodes normal. No abnormal inguinal nodes palpated Neurologic: Grossly normal   Pelvic: External genitalia:  no lesions              Urethra:  normal appearing urethra with no masses, tenderness or lesions, bladder and urethral meatus tenderness              Bartholins and Skenes: normal                 Vagina: atrophic appearing vagina with normal color and discharge, no lesions              Cervix: normal, non tender              Pap taken: yes Bimanual Exam:  Uterus:  normal size, contour, position, consistency, mobility, non-tender and anteverted              Adnexa: normal adnexa and no mass, fullness, tenderness               Rectovaginal: Confirms               Anus:  normal sphincter tone, no lesions  A:  Well Woman with normal exam  Menopausal no HRT  Atrophic vaginitis Vagifem working well  Fatigue  R/O UTI, WBC in urine, history of UTI  P:   Reviewed health and wellness pertinent to exam  Aware of need to evaluate if vaginal bleeding  Rx Vagifem see order  Take time for self, and rest periods as needed  Lab:CBC  Discussed exam no indication of UTI, but will do micro and culture. Increase water intake, decrease tea. Aware of UTI symptoms.  Mammogram yearly pap smear taken today  counseled on breast self exam, mammography screening, adequate intake of calcium and vitamin D, diet and exercise  return annually or prn  An After Visit Summary was printed and given to the patient.

## 2013-11-18 NOTE — Progress Notes (Deleted)
68 y.o. G3P3 Married Caucasian Fe here for annual exam.    Patient's last menstrual period was 08/01/2006.          Sexually active: yes  The current method of family planning is tubal ligation.    Exercising: yes  walking & stretching Smoker:  no  Health Maintenance: Pap: 11-07-11 neg MMG:  08-21-13 normal Colonoscopy:  2010 f/u 5 yrs BMD:   07-09-10 TDaP:  2008 Labs: Poct urine-wbc 1+ Self breast exam:done occ   reports that she has never smoked. She does not have any smokeless tobacco history on file. She reports that she does not drink alcohol or use illicit drugs.  Past Medical History  Diagnosis Date  . Arthritis   . CLL (chronic lymphocytic leukemia)     Rai stage 0.  . IBS (irritable bowel syndrome)   . Rheumatoid arthritis(714.0)   . Hypertension   . Depression   . Osteopenia     Past Surgical History  Procedure Laterality Date  . Colonoscopy w/ polypectomy      Pre-cancerous polyps removed.  . Endometrial biopsy      8/98  . Tubal ligation    . Cesarean section      Current Outpatient Prescriptions  Medication Sig Dispense Refill  . aspirin 325 MG EC tablet Take 325 mg by mouth as needed for pain (for headache).      . cetirizine (ZYRTEC) 10 MG tablet Take 10 mg by mouth as needed.      Marland Kitchen CRANBERRY PO Take by mouth daily.      . Estradiol (VAGIFEM) 10 MCG TABS vaginal tablet Place 1 tablet (10 mcg total) vaginally 2 (two) times a week.  8 tablet  12  . hydrochlorothiazide (HYDRODIURIL) 12.5 MG tablet Take 12.5 mg by mouth daily.      Marland Kitchen ibuprofen (ADVIL,MOTRIN) 200 MG tablet Take 200 mg by mouth as needed.      . nystatin-triamcinolone ointment (MYCOLOG) Apply topically 2 (two) times daily. For 5 days  30 g  0  . Probiotic Product (PROBIOTIC FORMULA PO) Take 1 tablet by mouth daily.      . pseudoephedrine (SUDAFED) 30 MG tablet Take 30 mg by mouth as needed.       No current facility-administered medications for this visit.    Family History  Problem  Relation Age of Onset  . Colon cancer Mother   . Stroke Father   . Diabetes Father   . Heart disease Father   . Hypertension Father   . Heart disease Maternal Grandmother   . Diabetes Maternal Grandmother   . Cancer Paternal Grandmother     female cancer  . Emphysema Maternal Grandfather   . Breast cancer Cousin     passed away age 59    ROS:  Pertinent items are noted in HPI.  Otherwise, a comprehensive ROS was negative.  Exam:   BP 112/62  Pulse 80  Resp 16  Ht 5' 5.5" (1.664 m)  Wt 143 lb (64.864 kg)  BMI 23.43 kg/m2  LMP 08/01/2006 Height: 5' 5.5" (166.4 cm)  Ht Readings from Last 3 Encounters:  11/18/13 5' 5.5" (1.664 m)  08/27/13 5\' 5"  (1.651 m)  05/20/13 5' 5.25" (1.657 m)    General appearance: alert, cooperative and appears stated age Head: Normocephalic, without obvious abnormality, atraumatic Neck: no adenopathy, supple, symmetrical, trachea midline and thyroid {EXAM; THYROID:18604} Lungs: clear to auscultation bilaterally Breasts: {Exam; breast:13139::"normal appearance, no masses or tenderness"} Heart: regular rate and  rhythm Abdomen: soft, non-tender; no masses,  no organomegaly Extremities: extremities normal, atraumatic, no cyanosis or edema Skin: Skin color, texture, turgor normal. No rashes or lesions Lymph nodes: Cervical, supraclavicular, and axillary nodes normal. No abnormal inguinal nodes palpated Neurologic: Grossly normal   Pelvic: External genitalia:  no lesions              Urethra:  normal appearing urethra with no masses, tenderness or lesions              Bartholin's and Skene's: normal                 Vagina: normal appearing vagina with normal color and discharge, no lesions              Cervix: {exam; cervix:14595}              Pap taken: {yes no:314532} Bimanual Exam:  Uterus:  {exam; uterus:12215}              Adnexa: {exam; adnexa:12223}               Rectovaginal: Confirms               Anus:  normal sphincter tone, no  lesions  A:  Well Woman with normal exam  P:   Pap smear as per guidelines   {plan; gyn:5269::"mammogram","pap smear","return annually or prn"}  An After Visit Summary was printed and given to the patient.

## 2013-11-19 LAB — IPS PAP SMEAR ONLY

## 2013-11-19 LAB — URINALYSIS, MICROSCOPIC ONLY
Bacteria, UA: NONE SEEN
Casts: NONE SEEN
Crystals: NONE SEEN
Squamous Epithelial / LPF: NONE SEEN

## 2013-11-19 LAB — URINE CULTURE
Colony Count: NO GROWTH
Organism ID, Bacteria: NO GROWTH

## 2013-11-27 NOTE — Progress Notes (Signed)
Reviewed personally.  M. Suzanne Donnella Morford, MD.  

## 2013-12-24 ENCOUNTER — Other Ambulatory Visit: Payer: Self-pay | Admitting: Certified Nurse Midwife

## 2013-12-25 ENCOUNTER — Encounter: Payer: Self-pay | Admitting: Obstetrics and Gynecology

## 2013-12-25 ENCOUNTER — Ambulatory Visit (INDEPENDENT_AMBULATORY_CARE_PROVIDER_SITE_OTHER): Payer: Medicare Other | Admitting: Obstetrics and Gynecology

## 2013-12-25 VITALS — BP 120/60 | HR 84 | Temp 98.5°F | Ht 65.5 in | Wt 143.0 lb

## 2013-12-25 DIAGNOSIS — R319 Hematuria, unspecified: Secondary | ICD-10-CM

## 2013-12-25 DIAGNOSIS — R300 Dysuria: Secondary | ICD-10-CM | POA: Diagnosis not present

## 2013-12-25 LAB — POCT URINALYSIS DIPSTICK
Bilirubin, UA: NEGATIVE
Glucose, UA: NEGATIVE
Ketones, UA: NEGATIVE
Nitrite, UA: NEGATIVE
Protein, UA: NEGATIVE
Urobilinogen, UA: NEGATIVE
pH, UA: 8

## 2013-12-25 MED ORDER — CIPROFLOXACIN HCL 500 MG PO TABS: 500.0000 mg | ORAL_TABLET | Freq: Two times a day (BID) | ORAL | Status: DC

## 2013-12-25 MED ORDER — FLUCONAZOLE 150 MG PO TABS: 150.0000 mg | ORAL_TABLET | Freq: Once | ORAL | Status: DC

## 2013-12-25 NOTE — Progress Notes (Signed)
Patient ID: Audrey Sweeney, female   DOB: 1946-01-10, 68 y.o.   MRN: [id number] GYNECOLOGY VISIT  PCP:   Leighton Ruff, MD  Referring provider:   HPI: 68 y.o.   Married  Caucasian  female   G3P3 with Patient's last menstrual period was 08/01/2006.   here for  Urinary frequency/dysuria.  Saw Evalee Mutton in April 2015.  Having pressure and back pain. Achy.  Dysuria.  Slight fever, a little elevated for patient. Nausea and no vomiting.  Increased cranberry and probiotics.  Urine culture was negative one month ago.  Urine culture was negative in October 2014. Urine culture was negative in May 2014.  Has chronic lymphocytic leukemia.    Urine:  2+ WBC's, Mod. RBC's  GYNECOLOGIC HISTORY: Patient's last menstrual period was 08/01/2006. Sexually active:  yes Partner preference: female Contraception:   Tubal Menopausal hormone therapy: Vagifem 2x weekly DES exposure:   no Blood transfusions:  no  Sexually transmitted diseases:   no GYN procedures and prior surgeries:  Tubal ligation, C-section. Last mammogram:  08-21-13 wnl:[facility]              Last pap and high risk HPV testing:    History of abnormal pap smear:     OB History   Grav Para Term Preterm Abortions TAB SAB Ect Mult Living   3 3       1 3        LIFESTYLE: Exercise:    Stretching/walking          Tobacco: no Alcohol:    no Drug use:  no  Patient Active Problem List   Diagnosis Date Noted  . Chronic lymphatic leukemia 11/13/2006    Past Medical History  Diagnosis Date  . Arthritis   . CLL (chronic lymphocytic leukemia)     Rai stage 0.  . IBS (irritable bowel syndrome)   . Rheumatoid arthritis(714.0)   . Hypertension   . Depression   . Osteopenia     Past Surgical History  Procedure Laterality Date  . Colonoscopy w/ polypectomy      Pre-cancerous polyps removed.  . Endometrial biopsy      8/98  . Tubal ligation    . Cesarean section      Current Outpatient Prescriptions   Medication Sig Dispense Refill  . cetirizine (ZYRTEC) 10 MG tablet Take 10 mg by mouth as needed.      Marland Kitchen CRANBERRY PO Take by mouth daily.      . hydrochlorothiazide (HYDRODIURIL) 12.5 MG tablet Take 12.5 mg by mouth daily.      Marland Kitchen ibuprofen (ADVIL,MOTRIN) 200 MG tablet Take 200 mg by mouth as needed.      . nystatin-triamcinolone ointment (MYCOLOG) Apply topically 2 (two) times daily. For 5 days  30 g  0  . Probiotic Product (PROBIOTIC FORMULA PO) Take 1 tablet by mouth daily.      . pseudoephedrine (SUDAFED) 30 MG tablet Take 30 mg by mouth as needed.      Marland Kitchen VAGIFEM 10 MCG TABS vaginal tablet INSERT 1 TABLET VAGINALLY TWICE A WEEK AS DIRECTED  24 tablet  3   No current facility-administered medications for this visit.     ALLERGIES: Contrast media and Provera  Family History  Problem Relation Age of Onset  . Colon cancer Mother   . Stroke Father   . Diabetes Father   . Heart disease Father   . Hypertension Father   . Heart disease Maternal Grandmother   .  Diabetes Maternal Grandmother   . Cancer Paternal Grandmother     female cancer  . Emphysema Maternal Grandfather   . Breast cancer Cousin     passed away age 50    History   Social History  . Marital Status: Married    Spouse Name: N/A    Number of Children: N/A  . Years of Education: N/A   Occupational History  . Not on file.   Social History Main Topics  . Smoking status: Never Smoker   . Smokeless tobacco: Not on file  . Alcohol Use: No  . Drug Use: No  . Sexual Activity: Yes    Partners: Male    Birth Control/ Protection: Surgical     Comment: btl   Other Topics Concern  . Not on file   Social History Narrative  . No narrative on file    ROS:  Pertinent items are noted in HPI.  PHYSICAL EXAMINATION:    BP 120/60  Pulse 84  Temp(Src) 98.5 F (36.9 C) (Oral)  Ht 5' 5.5" (1.664 m)  Wt 143 lb (64.864 kg)  BMI 23.43 kg/m2  LMP 08/01/2006   Wt Readings from Last 3 Encounters:  12/25/13 143 lb  (64.864 kg)  11/18/13 143 lb (64.864 kg)  08/27/13 141 lb 8 oz (64.184 kg)     Ht Readings from Last 3 Encounters:  12/25/13 5' 5.5" (1.664 m)  11/18/13 5' 5.5" (1.664 m)  08/27/13 5\' 5"  (1.651 m)    General appearance: alert, cooperative and appears stated age Head: Normocephalic, without obvious abnormality, atraumatic Neck: no adenopathy, supple, symmetrical, trachea midline and thyroid not enlarged, symmetric, no tenderness/mass/nodules Lungs: clear to auscultation bilaterally Breasts: Inspection negative, No nipple retraction or dimpling, No nipple discharge or bleeding, No axillary or supraclavicular adenopathy, Normal to palpation without dominant masses Heart: regular rate and rhythm Abdomen: soft, non-tender; no masses,  no organomegaly Back - mild right CVA tenderness.   ASSESSMENT  UTI.  History of E Coli UTIs on chart review.  UCs negative for the last year.   PLAN  Urine culture.  Ciprofloxacin 500 mg po bid for 7 days.  Diflucan 150 mg po times one at beginning of UTI treatment and one at the end of treatment. If this culture is negative, will send to urology.    15 minutes face to face time of which over 50% was spent in counseling.   An After Visit Summary was printed and given to the patient.

## 2013-12-25 NOTE — Patient Instructions (Signed)
Please call if you develop fever, nausea and vomiting, or increasing pain.

## 2013-12-25 NOTE — Addendum Note (Signed)
Addended by: Lowella Fairy on: 12/25/2013 02:06 PM   Modules accepted: Orders

## 2013-12-25 NOTE — Telephone Encounter (Signed)
Patient had AEX on 11/18/13-did not get refill at that time, normal MMG 08/21/13//kn

## 2013-12-27 ENCOUNTER — Telehealth: Payer: Self-pay | Admitting: Obstetrics and Gynecology

## 2013-12-27 NOTE — Telephone Encounter (Signed)
Urine culture shows E Coli.  No sensitivities are available at this time.  Continue Ciprofloxacin and call back if not feeling any better or if feeling worse.

## 2013-12-27 NOTE — Telephone Encounter (Signed)
Spoke with patient. Results given as seen below. Patient agreeable and states "I feel better than before but still not as well as I thought I would." Patient will complete cipro and give [place] a call back if she does not feel any better or if anything gets worse.   Routing to provider for final review. Patient agreeable to disposition. Will close encounter

## 2013-12-27 NOTE — Telephone Encounter (Signed)
Patient calling to see if results are back from last visit. She left a message on the answering machine at lunch.

## 2013-12-27 NOTE — Telephone Encounter (Signed)
Routing to Dr.Silva for review and advise of 5/27 lab results.

## 2013-12-28 LAB — URINE CULTURE: Colony Count: >=100000

## 2013-12-31 ENCOUNTER — Telehealth: Payer: Self-pay | Admitting: Certified Nurse Midwife

## 2013-12-31 MED ORDER — FLUCONAZOLE 150 MG PO TABS: 150.0000 mg | ORAL_TABLET | Freq: Once | ORAL | Status: DC

## 2013-12-31 NOTE — Telephone Encounter (Signed)
OK to treat with Diflucan 150mg  po x 1, repeat 48 hrs.  #2/0RF.  If continues, will need OV.

## 2013-12-31 NOTE — Telephone Encounter (Signed)
Patient called during lunch says she was seen last week for a UTI but is still having symptoms she has some questions for the nurse

## 2013-12-31 NOTE — Telephone Encounter (Signed)
Spoke with patient. Patient states that she came in last week to see Dr.Silva for a UTI and was placed on Cipro. Today is her last day taking the Cipro and she has developed some vaginal itching. Was given diflucan as well. Took the first diflucan at the beginning of taking cipro then took the second one on Saturday due to vaginal itching. Patient states that she is still having vaginal itching and does not know what she should do know. Denies vaginal discharge, pain, fever, chills, urinary symptoms, and abdominal pain. Advised patient would send a message over to Dr.Silva and give patient a call back with further instructions. Patient agreeable.

## 2013-12-31 NOTE — Telephone Encounter (Signed)
Spoke with patient. Advised of message from [place]. Patient agreeable. Rx sent to pharmacy of choice. Patient will call back for OV if symptoms continue after completing diflucan.  Routing to provider for final review. Patient agreeable to disposition. Will close encounter.

## 2014-01-06 ENCOUNTER — Encounter: Payer: Self-pay | Admitting: Certified Nurse Midwife

## 2014-01-06 ENCOUNTER — Ambulatory Visit (INDEPENDENT_AMBULATORY_CARE_PROVIDER_SITE_OTHER): Payer: Medicare Other | Admitting: Certified Nurse Midwife

## 2014-01-06 VITALS — BP 112/68 | HR 68 | Temp 98.4°F | Resp 16 | Ht 65.5 in | Wt 143.0 lb

## 2014-01-06 DIAGNOSIS — N390 Urinary tract infection, site not specified: Secondary | ICD-10-CM

## 2014-01-06 DIAGNOSIS — B3731 Acute candidiasis of vulva and vagina: Secondary | ICD-10-CM

## 2014-01-06 DIAGNOSIS — B373 Candidiasis of vulva and vagina: Secondary | ICD-10-CM | POA: Diagnosis not present

## 2014-01-06 LAB — POCT URINALYSIS DIPSTICK
Bilirubin, UA: NEGATIVE
Blood, UA: NEGATIVE
Glucose, UA: NEGATIVE
Ketones, UA: NEGATIVE
Leukocytes, UA: NEGATIVE
Nitrite, UA: NEGATIVE
Protein, UA: NEGATIVE
Urobilinogen, UA: NEGATIVE
pH, UA: 5

## 2014-01-06 MED ORDER — TERCONAZOLE 0.4 % VA CREA: 1.0000 | TOPICAL_CREAM | Freq: Every day | VAGINAL | Status: DC

## 2014-01-06 NOTE — Patient Instructions (Signed)
Monilial Vaginitis  Vaginitis in a soreness, swelling and redness (inflammation) of the vagina and vulva. Monilial vaginitis is not a sexually transmitted infection.  CAUSES   Yeast vaginitis is caused by yeast (candida) that is normally found in your vagina. With a yeast infection, the candida has overgrown in number to a point that upsets the chemical balance.  SYMPTOMS   · White, thick vaginal discharge.  · Swelling, itching, redness and irritation of the vagina and possibly the lips of the vagina (vulva).  · Burning or painful urination.  · Painful intercourse.  DIAGNOSIS   Things that may contribute to monilial vaginitis are:  · Postmenopausal and virginal states.  · Pregnancy.  · Infections.  · Being tired, sick or stressed, especially if you had monilial vaginitis in the past.  · Diabetes. Good control will help lower the chance.  · Birth control pills.  · Tight fitting garments.  · Using bubble bath, feminine sprays, douches or deodorant tampons.  · Taking certain medications that kill germs (antibiotics).  · Sporadic recurrence can occur if you become ill.  TREATMENT   Your caregiver will give you medication.  · There are several kinds of anti monilial vaginal creams and suppositories specific for monilial vaginitis. For recurrent yeast infections, use a suppository or cream in the vagina 2 times a week, or as directed.  · Anti-monilial or steroid cream for the itching or irritation of the vulva may also be used. Get your caregiver's permission.  · Painting the vagina with methylene blue solution may help if the monilial cream does not work.  · Eating yogurt may help prevent monilial vaginitis.  HOME CARE INSTRUCTIONS   · Finish all medication as prescribed.  · Do not have sex until treatment is completed or after your caregiver tells you it is okay.  · Take warm sitz baths.  · Do not douche.  · Do not use tampons, especially scented ones.  · Wear cotton underwear.  · Avoid tight pants and panty  hose.  · Tell your sexual partner that you have a yeast infection. They should go to their caregiver if they have symptoms such as mild rash or itching.  · Your sexual partner should be treated as well if your infection is difficult to eliminate.  · Practice safer sex. Use condoms.  · Some vaginal medications cause latex condoms to fail. Vaginal medications that harm condoms are:  · Cleocin cream.  · Butoconazole (Femstat®).  · Terconazole (Terazol®) vaginal suppository.  · Miconazole (Monistat®) (may be purchased over the counter).  SEEK MEDICAL CARE IF:   · You have a temperature by mouth above 102° F (38.9° C).  · The infection is getting worse after 2 days of treatment.  · The infection is not getting better after 3 days of treatment.  · You develop blisters in or around your vagina.  · You develop vaginal bleeding, and it is not your menstrual period.  · You have pain when you urinate.  · You develop intestinal problems.  · You have pain with sexual intercourse.  Document Released: 04/27/2005 Document Revised: 10/10/2011 Document Reviewed: 01/09/2009  Exit[employer]® Patient Information ©2014 [employer].

## 2014-01-06 NOTE — Progress Notes (Signed)
68 y.o. Married Caucasian female G3P3 here for follow up of E. Coli  UTI treated with Cipro initiated on 12/25/13. Patient was also treated with Diflucan x 4. Continues to have vaginal itching external/internal. Also still has some frequency, pressure. Denies back pain, fever or chills or pain with urination. Completed all medication as directed.    O: Healthy WD,WN female Affect:normal  Skin:warm and dry CVAT: negative bilateral Abdomen:negative suprapubic Pelvic exam:EXTERNAL GENITALIA: normal appearing vulva with no masses, tenderness or lesions Bladder: not tender, urethra meatus no redness or tenderness, Urethra, ? Slight prolapse, not tender VAGINA: discharge white thick and Wet Prep/KOH no pathogens, pH 4.0 and positive for yeast CERVIX: no lesions or cervical motion tenderness and normal UTERUS: anteverted and non tender ADNEXA: no masses palpable and nontender  A:UTI ? Resolved Yeast vaginitis   P: Discussed findings of ? UTI resolved and discussed possible urology referral with long history of UTI. Patient will consider, but does not feel really necessary. Lab: Urine culture Reviewed findings of yeast. Rx Terazol 7 see order. Comfort measures given. Will wait for culture results and discuss status with patient and decide on referral.  Rv prn        RV

## 2014-01-08 LAB — URINE CULTURE: Colony Count: 45000

## 2014-01-11 NOTE — Progress Notes (Signed)
Reviewed personally.  M. Suzanne Hoa Briggs, MD.  

## 2014-01-13 ENCOUNTER — Telehealth: Payer: Self-pay | Admitting: Certified Nurse Midwife

## 2014-01-13 NOTE — Telephone Encounter (Signed)
Needs ov

## 2014-01-13 NOTE — Telephone Encounter (Signed)
Spoke with patient. Patient states that she was supposed to call Debbie after completing her medication and give her an update. Patient finished Terazol last night and is calling to let Regina Eck CNM know that she is having "vaginal itching and yellow discharge." Denies abdominal pain, fevers, chills, and nausea. Patient is having urgency with urination. Patient states "I do not know if I need a referral now or if this is a bad yeast infection." Advised patient would send a message over to [facility] and give patient a call back with further recommendations and instructions. Patient agreeable.  Regina Eck CNM, at last office visit a referral to urology was mentioned but patient did not want to go that route at that time. Would you like patient to be referred at this time or would you like to see her for an office visit?

## 2014-01-13 NOTE — Telephone Encounter (Signed)
Patient is asking to talk with Lauris Chroman nurse regarding some follow up treatment. No further details given.

## 2014-01-14 ENCOUNTER — Encounter: Payer: Self-pay | Admitting: Certified Nurse Midwife

## 2014-01-14 ENCOUNTER — Ambulatory Visit (INDEPENDENT_AMBULATORY_CARE_PROVIDER_SITE_OTHER): Payer: Medicare Other | Admitting: Certified Nurse Midwife

## 2014-01-14 VITALS — BP 116/62 | HR 68 | Resp 16 | Ht 65.5 in | Wt 145.0 lb

## 2014-01-14 DIAGNOSIS — B3731 Acute candidiasis of vulva and vagina: Secondary | ICD-10-CM

## 2014-01-14 DIAGNOSIS — N390 Urinary tract infection, site not specified: Secondary | ICD-10-CM | POA: Diagnosis not present

## 2014-01-14 DIAGNOSIS — N952 Postmenopausal atrophic vaginitis: Secondary | ICD-10-CM

## 2014-01-14 DIAGNOSIS — B373 Candidiasis of vulva and vagina: Secondary | ICD-10-CM

## 2014-01-14 LAB — POCT URINALYSIS DIPSTICK
Bilirubin, UA: NEGATIVE
Blood, UA: NEGATIVE
Glucose, UA: NEGATIVE
Ketones, UA: NEGATIVE
Leukocytes, UA: NEGATIVE
Nitrite, UA: NEGATIVE
Protein, UA: NEGATIVE
Urobilinogen, UA: NEGATIVE
pH, UA: 7

## 2014-01-14 NOTE — Telephone Encounter (Signed)
Spoke with patient. Advised of message from [place] as seen below. Patient agreeable. Requesting afternoon appointment. Appointment scheduled for today at 3:30pm with Regina Eck CNM. Patient agreeable to date and time.   Routing to provider for final review. Patient agreeable to disposition. Will close encounter

## 2014-01-14 NOTE — Progress Notes (Signed)
68 y.o. Married Caucasian female G3P3 here for follow up of UTI treated with Cipro initiated on 01/06/14. and follow up of yeast vaginitis treated with Terazol vaginal cream Completed all medication as directed.  Denies any symptoms of urinary burning, pain or frequency. Patient has increased water and has been taking cranberry tablets and feels much better. Patient denies increase vaginal discharge or burning, just slight itching on external skin. Has changed underwear type which seems to be helping Patient stopped Vagifem during treatment and has noticed increase dryness. No other health issues. Patient does prefer urology referral at this time.   O: Healthy WD,WN female Affect: normal Skin:warm and dry CVAT bilateral negative Abdomen:soft, nontender, negative suprapubic Pelvic exam:EXTERNAL GENITALIA: normal appearing vulva with no masses, tenderness or lesions, no scaling or exudate present, wet prep taken no pathogens noted VAGINA: no abnormal discharge or lesions and Wet Prep/KOH no pathogens ph 4.0, atrophic appearance   A:UTI probably Resolved  Yeast vaginitis and vulvitis resolved Atrophic Vaginitis using Vagifem    P: Discussed findings of no UTI symptoms and no yeast present. Discussed dryness is causing slight symptoms. Re start Vagifem 3 x weekly and Aveeno anti itch cream with moisture to external tissue prn. Aveeno bath prn comfort. Patient relieved yeast resolved.   Labs Urine culture  RV prn

## 2014-01-15 NOTE — Progress Notes (Signed)
Reviewed personally.  M. Suzanne Haniyyah Sakuma, MD.  

## 2014-01-16 LAB — URINE CULTURE
Colony Count: NO GROWTH
Organism ID, Bacteria: NO GROWTH

## 2014-02-24 DIAGNOSIS — H52209 Unspecified astigmatism, unspecified eye: Secondary | ICD-10-CM | POA: Diagnosis not present

## 2014-02-24 DIAGNOSIS — H5200 Hypermetropia, unspecified eye: Secondary | ICD-10-CM | POA: Diagnosis not present

## 2014-02-24 DIAGNOSIS — H18839 Recurrent erosion of cornea, unspecified eye: Secondary | ICD-10-CM | POA: Diagnosis not present

## 2014-03-07 DIAGNOSIS — J019 Acute sinusitis, unspecified: Secondary | ICD-10-CM | POA: Diagnosis not present

## 2014-03-14 DIAGNOSIS — J329 Chronic sinusitis, unspecified: Secondary | ICD-10-CM | POA: Diagnosis not present

## 2014-03-28 DIAGNOSIS — J309 Allergic rhinitis, unspecified: Secondary | ICD-10-CM | POA: Diagnosis not present

## 2014-04-15 DIAGNOSIS — I10 Essential (primary) hypertension: Secondary | ICD-10-CM | POA: Diagnosis not present

## 2014-04-15 DIAGNOSIS — E782 Mixed hyperlipidemia: Secondary | ICD-10-CM | POA: Diagnosis not present

## 2014-04-15 DIAGNOSIS — Z79899 Other long term (current) drug therapy: Secondary | ICD-10-CM | POA: Diagnosis not present

## 2014-04-17 DIAGNOSIS — Z1331 Encounter for screening for depression: Secondary | ICD-10-CM | POA: Diagnosis not present

## 2014-04-17 DIAGNOSIS — C9110 Chronic lymphocytic leukemia of B-cell type not having achieved remission: Secondary | ICD-10-CM | POA: Diagnosis not present

## 2014-04-17 DIAGNOSIS — I10 Essential (primary) hypertension: Secondary | ICD-10-CM | POA: Diagnosis not present

## 2014-04-17 DIAGNOSIS — Z79899 Other long term (current) drug therapy: Secondary | ICD-10-CM | POA: Diagnosis not present

## 2014-04-17 DIAGNOSIS — J309 Allergic rhinitis, unspecified: Secondary | ICD-10-CM | POA: Diagnosis not present

## 2014-04-17 DIAGNOSIS — R259 Unspecified abnormal involuntary movements: Secondary | ICD-10-CM | POA: Diagnosis not present

## 2014-04-17 DIAGNOSIS — E7800 Pure hypercholesterolemia, unspecified: Secondary | ICD-10-CM | POA: Diagnosis not present

## 2014-05-08 DIAGNOSIS — Z23 Encounter for immunization: Secondary | ICD-10-CM | POA: Diagnosis not present

## 2014-05-16 ENCOUNTER — Other Ambulatory Visit: Payer: Self-pay

## 2014-06-02 ENCOUNTER — Encounter: Payer: Self-pay | Admitting: Certified Nurse Midwife

## 2014-06-09 ENCOUNTER — Other Ambulatory Visit: Payer: Self-pay | Admitting: Dermatology

## 2014-06-09 DIAGNOSIS — L570 Actinic keratosis: Secondary | ICD-10-CM | POA: Diagnosis not present

## 2014-06-09 DIAGNOSIS — D485 Neoplasm of uncertain behavior of skin: Secondary | ICD-10-CM | POA: Diagnosis not present

## 2014-06-09 DIAGNOSIS — D2261 Melanocytic nevi of right upper limb, including shoulder: Secondary | ICD-10-CM | POA: Diagnosis not present

## 2014-06-09 DIAGNOSIS — D239 Other benign neoplasm of skin, unspecified: Secondary | ICD-10-CM | POA: Diagnosis not present

## 2014-07-18 ENCOUNTER — Other Ambulatory Visit: Payer: Self-pay

## 2014-07-18 DIAGNOSIS — Z1231 Encounter for screening mammogram for malignant neoplasm of breast: Secondary | ICD-10-CM

## 2014-08-22 ENCOUNTER — Ambulatory Visit: Payer: Medicare Other

## 2014-08-25 ENCOUNTER — Ambulatory Visit: Payer: Medicare Other | Admitting: Oncology

## 2014-08-25 ENCOUNTER — Other Ambulatory Visit (HOSPITAL_BASED_OUTPATIENT_CLINIC_OR_DEPARTMENT_OTHER): Payer: Medicare Other

## 2014-08-25 ENCOUNTER — Other Ambulatory Visit: Payer: Medicare Other

## 2014-08-25 ENCOUNTER — Ambulatory Visit (HOSPITAL_BASED_OUTPATIENT_CLINIC_OR_DEPARTMENT_OTHER): Payer: Medicare Other | Admitting: Hematology and Oncology

## 2014-08-25 ENCOUNTER — Encounter: Payer: Self-pay | Admitting: Hematology and Oncology

## 2014-08-25 VITALS — BP 138/81 | HR 80 | Temp 97.6°F | Resp 18 | Ht 65.0 in | Wt 143.2 lb

## 2014-08-25 DIAGNOSIS — Z23 Encounter for immunization: Secondary | ICD-10-CM | POA: Diagnosis not present

## 2014-08-25 DIAGNOSIS — C9110 Chronic lymphocytic leukemia of B-cell type not having achieved remission: Secondary | ICD-10-CM

## 2014-08-25 LAB — CBC WITH DIFFERENTIAL/PLATELET
BASO%: 0.5 % (ref 0.0–2.0)
Basophils Absolute: 0.1 10*3/uL (ref 0.0–0.1)
EOS%: 1.3 % (ref 0.0–7.0)
Eosinophils Absolute: 0.2 10*3/uL (ref 0.0–0.5)
HCT: 38.4 % (ref 34.8–46.6)
HGB: 12.5 g/dL (ref 11.6–15.9)
LYMPH%: 65.4 % — ABNORMAL HIGH (ref 14.0–49.7)
MCH: 27.8 pg (ref 25.1–34.0)
MCHC: 32.6 g/dL (ref 31.5–36.0)
MCV: 85.1 fL (ref 79.5–101.0)
MONO#: 0.4 10*3/uL (ref 0.1–0.9)
MONO%: 3.4 % (ref 0.0–14.0)
NEUT#: 3.6 10*3/uL (ref 1.5–6.5)
NEUT%: 29.4 % — ABNORMAL LOW (ref 38.4–76.8)
Platelets: 230 10*3/uL (ref 145–400)
RBC: 4.51 10*6/uL (ref 3.70–5.45)
RDW: 12.8 % (ref 11.2–14.5)
WBC: 12.4 10*3/uL — ABNORMAL HIGH (ref 3.9–10.3)
lymph#: 8.1 10*3/uL — ABNORMAL HIGH (ref 0.9–3.3)

## 2014-08-25 LAB — LACTATE DEHYDROGENASE (CC13): LDH: 177 U/L (ref 125–245)

## 2014-08-25 LAB — COMPREHENSIVE METABOLIC PANEL (CC13)
ALT: 21 U/L (ref 0–55)
AST: 18 U/L (ref 5–34)
Albumin: 4.4 g/dL (ref 3.5–5.0)
Alkaline Phosphatase: 111 U/L (ref 40–150)
Anion Gap: 12 mEq/L — ABNORMAL HIGH (ref 3–11)
BUN: 13.8 mg/dL (ref 7.0–26.0)
CO2: 23 mEq/L (ref 22–29)
Calcium: 9.8 mg/dL (ref 8.4–10.4)
Chloride: 105 mEq/L (ref 98–109)
Creatinine: 0.6 mg/dL (ref 0.6–1.1)
EGFR: >90 mL/min/{1.73_m2} (ref >90)
Glucose: 99 mg/dl (ref 70–140)
Potassium: 3.8 mEq/L (ref 3.5–5.1)
Sodium: 139 mEq/L (ref 136–145)
Total Bilirubin: 0.54 mg/dL (ref 0.20–1.20)
Total Protein: 6.8 g/dL (ref 6.4–8.3)

## 2014-08-25 LAB — TECHNOLOGIST REVIEW

## 2014-08-25 MED ORDER — PNEUMOCOCCAL 13-VAL CONJ VACC IM SUSP
0.5000 mL | Freq: Once | INTRAMUSCULAR | Status: AC
  Administered 2014-08-25: 0.5 mL via INTRAMUSCULAR
  Filled 2014-08-25: qty 0.5

## 2014-08-25 NOTE — Progress Notes (Signed)
[facility] progress notes  Patient Care Team: Gerrit Heck, MD as PCP - General (Family Medicine) Heath Lark, MD as Consulting Physician (Hematology and Oncology)  CHIEF COMPLAINTS/PURPOSE OF VISIT:  CLL and observation  HISTORY OF PRESENTING ILLNESS:  Audrey Sweeney 69 y.o. female was transferred to my care after her prior physician has left.  I reviewed the patient's records extensive and collaborated the history with the patient. Summary of her history is as follows:   Chronic lymphatic leukemia   02/24/2004 Pathology Results  Case #: [id number] confirmed CLL  She has had stable blood counts with white counts in the range of 16 to 31,000 with average white count running 20,000 with 15-20% neutrophils and 75-80% lymphocytes. She has not required any treatment to date. She has not had any problems with recurrent or severe infection. Serum immunoglobulins done back in 2009 showed a mild depression of IgG.  She feels well. Denies new lymphadenopathy. No abnormal anorexia or weight loss.  MEDICAL HISTORY:  Past Medical History  Diagnosis Date  . Arthritis   . CLL (chronic lymphocytic leukemia)     Rai stage 0.  . IBS (irritable bowel syndrome)   . Rheumatoid arthritis(714.0)   . Hypertension   . Depression   . Osteopenia     SURGICAL HISTORY: Past Surgical History  Procedure Laterality Date  . Colonoscopy w/ polypectomy      Pre-cancerous polyps removed.  . Endometrial biopsy      8/98  . Tubal ligation    . Cesarean section      SOCIAL HISTORY: History   Social History  . Marital Status: Married    Spouse Name: N/A    Number of Children: N/A  . Years of Education: N/A   Occupational History  . Not on file.   Social History Main Topics  . Smoking status: Never Smoker   . Smokeless tobacco: Never Used  . Alcohol Use: No  . Drug Use: No  . Sexual Activity:    Partners: Male    Birth Control/ Protection: Surgical   Comment: btl   Other Topics Concern  . Not on file   Social History Narrative    FAMILY HISTORY: Family History  Problem Relation Age of Onset  . Colon cancer Mother   . Stroke Father   . Diabetes Father   . Heart disease Father   . Hypertension Father   . Heart disease Maternal Grandmother   . Diabetes Maternal Grandmother   . Cancer Paternal Grandmother     female cancer  . Emphysema Maternal Grandfather   . Breast cancer Cousin     passed away age 40    ALLERGIES:  is allergic to contrast media and provera.  MEDICATIONS:  Current Outpatient Prescriptions  Medication Sig Dispense Refill  . cetirizine (ZYRTEC) 10 MG tablet Take 10 mg by mouth as needed.    . fluticasone (FLONASE) 50 MCG/ACT nasal spray Place into both nostrils daily as needed for allergies or rhinitis.    . hydrochlorothiazide (HYDRODIURIL) 12.5 MG tablet Take 12.5 mg by mouth daily.    Marland Kitchen ibuprofen (ADVIL,MOTRIN) 200 MG tablet Take 200 mg by mouth as needed.    Marland Kitchen VAGIFEM 10 MCG TABS vaginal tablet INSERT 1 TABLET VAGINALLY TWICE A WEEK AS DIRECTED 24 tablet 3   No current facility-administered medications for this visit.    REVIEW OF SYSTEMS:   Constitutional: Denies fevers, chills or abnormal night sweats Eyes: Denies blurriness of  vision, double vision or watery eyes Ears, nose, mouth, throat, and face: Denies mucositis or sore throat Respiratory: Denies cough, dyspnea or wheezes Cardiovascular: Denies palpitation, chest discomfort or lower extremity swelling Gastrointestinal:  Denies nausea, heartburn or change in bowel habits Skin: Denies abnormal skin rashes Lymphatics: Denies new lymphadenopathy or easy bruising Neurological:Denies numbness, tingling or new weaknesses Behavioral/Psych: Mood is stable, no new changes  All other systems were reviewed with the patient and are negative.  PHYSICAL EXAMINATION: ECOG PERFORMANCE STATUS: 0 - Asymptomatic  Filed Vitals:   08/25/14 1331  BP:  138/81  Pulse: 80  Temp: 97.6 F (36.4 C)  Resp: 18   Filed Weights   08/25/14 1331  Weight: 143 lb 3.2 oz (64.955 kg)    GENERAL:alert, no distress and comfortable SKIN: skin color, texture, turgor are normal, no rashes or significant lesions EYES: normal, conjunctiva are pink and non-injected, sclera clear OROPHARYNX:no exudate, normal lips, buccal mucosa, and tongue  NECK: supple, thyroid normal size, non-tender, without nodularity LYMPH:  no palpable lymphadenopathy in the cervical, axillary or inguinal LUNGS: clear to auscultation and percussion with normal breathing effort HEART: regular rate & rhythm and no murmurs without lower extremity edema ABDOMEN:abdomen soft, non-tender and normal bowel sounds Musculoskeletal:no cyanosis of digits and no clubbing  PSYCH: alert & oriented x 3 with fluent speech NEURO: no focal motor/sensory deficits  LABORATORY DATA:  I have reviewed the data as listed Lab Results  Component Value Date   WBC 12.4* 08/25/2014   HGB 12.5 08/25/2014   HCT 38.4 08/25/2014   MCV 85.1 08/25/2014   PLT 230 08/25/2014    Recent Labs  08/27/13 1035 08/25/14 1309  NA 141 139  K 4.2 3.8  CO2 28 23  GLUCOSE 102 99  BUN 13.4 13.8  CREATININE 0.6 0.6  CALCIUM 10.2 9.8  PROT 7.1 6.8  ALBUMIN 4.5 4.4  AST 22 18  ALT 34 21  ALKPHOS 111 111  BILITOT 0.60 0.54    ASSESSMENT & PLAN:  Chronic lymphatic leukemia Clinically, she has no signs of disease progression. She remain at stage 0. She is up to date with all her vaccination programs. I recommended additional pneumococcal vaccine with Prevnar 13 and she agreed to proceed today. She is up-to-date with all preventive programs. She will call her gastroenterologist for repeat colonoscopy this year. I will see her on a yearly basis with history, physical examination and blood work.    Orders Placed This Encounter  Procedures  . CBC with Differential/Platelet    Standing Status: Future      Number of Occurrences:      Standing Expiration Date: 10/30/2015    All questions were answered. The patient knows to call the clinic with any problems, questions or concerns. I spent 15 minutes counseling the patient face to face. The total time spent in the appointment was 20 minutes and more than 50% was on counseling.     [facility], [place], MD 08/25/2014 2:23 PM

## 2014-08-25 NOTE — Assessment & Plan Note (Signed)
Clinically, she has no signs of disease progression. She remain at stage 0. She is up to date with all her vaccination programs. I recommended additional pneumococcal vaccine with Prevnar 13 and she agreed to proceed today. She is up-to-date with all preventive programs. She will call her gastroenterologist for repeat colonoscopy this year. I will see her on a yearly basis with history, physical examination and blood work.

## 2014-08-26 ENCOUNTER — Ambulatory Visit: Payer: Medicare Other | Admitting: Hematology and Oncology

## 2014-08-26 ENCOUNTER — Other Ambulatory Visit: Payer: Medicare Other

## 2014-08-29 ENCOUNTER — Ambulatory Visit
Admission: RE | Admit: 2014-08-29 | Discharge: 2014-08-29 | Disposition: A | Discharge status: Home / Self Care | Payer: Medicare Other | Source: Ambulatory Visit

## 2014-08-29 DIAGNOSIS — Z1231 Encounter for screening mammogram for malignant neoplasm of breast: Secondary | ICD-10-CM | POA: Diagnosis not present

## 2014-09-01 MED ORDER — DIPHENHYDRAMINE HCL 50 MG/ML IJ SOLN
INTRAMUSCULAR | Status: AC
  Filled 2014-09-01: qty 1

## 2014-09-01 MED ORDER — LORAZEPAM 1 MG PO TABS
ORAL_TABLET | ORAL | Status: AC
  Filled 2014-09-01: qty 1

## 2014-09-01 MED ORDER — DEXAMETHASONE SODIUM PHOSPHATE 10 MG/ML IJ SOLN
INTRAMUSCULAR | Status: AC
  Filled 2014-09-01: qty 1

## 2014-09-01 MED ORDER — ONDANSETRON 8 MG/NS 50 ML IVPB
INTRAVENOUS | Status: AC
  Filled 2014-09-01: qty 8

## 2014-09-01 MED ORDER — ACETAMINOPHEN 325 MG PO TABS
ORAL_TABLET | ORAL | Status: AC
  Filled 2014-09-01: qty 2

## 2014-10-27 ENCOUNTER — Telehealth: Payer: Self-pay | Admitting: Certified Nurse Midwife

## 2014-10-27 NOTE — Telephone Encounter (Signed)
Spoke with patient. Patient states that she has been taking OTC AZO and UTVibrance for a week. Patient is having vaginal itching, burning with urination and cloudy urine. Denies any fevers or lower back pain. Advised patient will need to be seen for further evaluation. Patient is agreeable. Requesting afternoon appointment tomorrow due to work schedule. Appointment scheduled for tomorrow at 2:15pm with Milford Cage, [place]. Patient is agreeable to date and time.  Routing to provider for final review. Patient agreeable to disposition. Will close encounter

## 2014-10-27 NOTE — Telephone Encounter (Signed)
Pt left voicemail requesting to speak with D.Leonard's nurse. No details given.  Pt requested call to: [phone]

## 2014-10-28 ENCOUNTER — Encounter: Payer: Self-pay | Admitting: Nurse Practitioner

## 2014-10-28 ENCOUNTER — Ambulatory Visit (INDEPENDENT_AMBULATORY_CARE_PROVIDER_SITE_OTHER): Payer: Medicare Other | Admitting: Nurse Practitioner

## 2014-10-28 VITALS — BP 126/62 | HR 76 | Temp 97.7°F | Resp 16 | Ht 65.0 in | Wt 147.0 lb

## 2014-10-28 DIAGNOSIS — R300 Dysuria: Secondary | ICD-10-CM

## 2014-10-28 DIAGNOSIS — N760 Acute vaginitis: Secondary | ICD-10-CM | POA: Diagnosis not present

## 2014-10-28 LAB — POCT URINALYSIS DIPSTICK
Bilirubin, UA: NEGATIVE
Blood, UA: NEGATIVE
Glucose, UA: NEGATIVE
Ketones, UA: NEGATIVE
Nitrite, UA: NEGATIVE
Protein, UA: NEGATIVE
Urobilinogen, UA: NEGATIVE
pH, UA: 6

## 2014-10-28 MED ORDER — NYSTATIN-TRIAMCINOLONE 100000-0.1 UNIT/GM-% EX OINT: 1.0000 "application " | TOPICAL_OINTMENT | Freq: Two times a day (BID) | CUTANEOUS | Status: DC

## 2014-10-28 MED ORDER — CIPROFLOXACIN HCL 500 MG PO TABS: 500.0000 mg | ORAL_TABLET | Freq: Two times a day (BID) | ORAL | Status: DC

## 2014-10-28 MED ORDER — FLUCONAZOLE 150 MG PO TABS: 150.0000 mg | ORAL_TABLET | Freq: Once | ORAL | Status: DC

## 2014-10-28 NOTE — Progress Notes (Signed)
69 y.o.Married white female here with complaint of vaginal symptoms of itching, burning, and increase discharge. Describes discharge as yellow. Onset of symptoms 5-7 days ago. Denies new personal products or vaginal dryness. No STD concerns. Urinary symptoms started on  10/21/14 with urgency frequency, dysuria, bladder pressure, low back pain an started on AZO and UTVibrance.   O:Healthy female WDWN Affect: normal, orientation x 3  Exam: alert, no distress Abdomen: soft and nob tender Lymph node: no enlargement or tenderness Pelvic exam: External genital: normal female BUS: negative Vagina:thin yellow vaginal discharge noted. Affirm taken Cervix: normal, non tender, no CMT Uterus: normal, non tender Adnexa:normal, non tender, no masses or fullness noted  Affirm is done   A: Normal pelvic exam  Postmenopausal  Atrophic vaginitis on Vagifem - but using only once a week  R/O UTI     P: Discussed findings of atrophic vaginitis and etiology. Discussed Aveeno or baking soda sitz bath for comfort. Avoid moist clothes or pads for extended period of time. If working out in gym clothes or swim suits for long periods of time change underwear or bottoms of swimsuit if possible. Olive Oil/Coconut Oil use for skin protection prior to activity can be used to external skin.  Rx: she will increase Vagifem back to twice weekly for a while  Will follow with urine culture and micro  Will follow with Affirm and treat if needed  Started on Cipro 500 mg BID # 14  Follow up Diflucan if needed    RV prn

## 2014-10-29 LAB — WET PREP BY MOLECULAR PROBE
Candida species: NEGATIVE
Gardnerella vaginalis: NEGATIVE
Trichomonas vaginosis: NEGATIVE

## 2014-10-29 LAB — URINALYSIS, MICROSCOPIC ONLY
Casts: NONE SEEN
Crystals: NONE SEEN
Squamous Epithelial / LPF: NONE SEEN

## 2014-10-29 NOTE — Progress Notes (Signed)
Encounter reviewed by Dr. Shavaughn Seidl Silva.  

## 2014-10-31 LAB — URINE CULTURE: Colony Count: >=100000

## 2014-11-19 ENCOUNTER — Telehealth: Payer: Self-pay | Admitting: *Deleted

## 2014-11-19 NOTE — Telephone Encounter (Signed)
Pt notified in result note.  Closing encounter. 

## 2014-11-19 NOTE — Telephone Encounter (Signed)
-----   Message from Antonietta Barcelona, [place] sent at 10/31/2014 10:09 AM EDT ----- Results via my chart:  Needs TOC  Catia, the urine culture is finally back and it does show E. Coli infection.  The antibiotics you are on is the correct one.  You should come in for a test of cure 1 week after completion of antibiotics.  This is a nurse visit to leave a specimen and send back to the lab.

## 2014-11-19 NOTE — Telephone Encounter (Signed)
I have attempted to contact this patient by phone with the following results: left message to return call to [place] at [phone] answering machine (home per [facility]).  No personal information given.  [phone] (Home) RE:  Pt needs TOC appt.  None scheduled.

## 2014-11-24 ENCOUNTER — Ambulatory Visit (INDEPENDENT_AMBULATORY_CARE_PROVIDER_SITE_OTHER): Payer: Medicare Other | Admitting: Certified Nurse Midwife

## 2014-11-24 ENCOUNTER — Encounter: Payer: Self-pay | Admitting: Certified Nurse Midwife

## 2014-11-24 VITALS — BP 94/60 | HR 68 | Resp 16 | Ht 65.25 in | Wt 144.0 lb

## 2014-11-24 DIAGNOSIS — E559 Vitamin D deficiency, unspecified: Secondary | ICD-10-CM | POA: Diagnosis not present

## 2014-11-24 DIAGNOSIS — Z87440 Personal history of urinary (tract) infections: Secondary | ICD-10-CM | POA: Diagnosis not present

## 2014-11-24 DIAGNOSIS — Z1389 Encounter for screening for other disorder: Secondary | ICD-10-CM | POA: Diagnosis not present

## 2014-11-24 DIAGNOSIS — Z79899 Other long term (current) drug therapy: Secondary | ICD-10-CM | POA: Diagnosis not present

## 2014-11-24 DIAGNOSIS — N952 Postmenopausal atrophic vaginitis: Secondary | ICD-10-CM

## 2014-11-24 DIAGNOSIS — N390 Urinary tract infection, site not specified: Secondary | ICD-10-CM | POA: Diagnosis not present

## 2014-11-24 DIAGNOSIS — Z01419 Encounter for gynecological examination (general) (routine) without abnormal findings: Secondary | ICD-10-CM

## 2014-11-24 DIAGNOSIS — E780 Pure hypercholesterolemia: Secondary | ICD-10-CM | POA: Diagnosis not present

## 2014-11-24 DIAGNOSIS — Z124 Encounter for screening for malignant neoplasm of cervix: Secondary | ICD-10-CM

## 2014-11-24 DIAGNOSIS — J329 Chronic sinusitis, unspecified: Secondary | ICD-10-CM | POA: Diagnosis not present

## 2014-11-24 DIAGNOSIS — C9110 Chronic lymphocytic leukemia of B-cell type not having achieved remission: Secondary | ICD-10-CM | POA: Diagnosis not present

## 2014-11-24 DIAGNOSIS — Z0000 Encounter for general adult medical examination without abnormal findings: Secondary | ICD-10-CM | POA: Diagnosis not present

## 2014-11-24 DIAGNOSIS — I10 Essential (primary) hypertension: Secondary | ICD-10-CM | POA: Diagnosis not present

## 2014-11-24 NOTE — Patient Instructions (Signed)

## 2014-11-24 NOTE — Progress Notes (Signed)
69 y.o. G3P3 Married  Caucasian Fe here for annual exam. Menopausal no HRT. Denies vaginal bleeding. Using Vagifem for vaginal dryness with fair result. Sees Dr. Drema Dallas, aex/labs and hypertension management. Has appointment with her today. Has allergies and wheezing currently, on medication. Continues with oncology for chronic leukemia, no change. Also here for TOC from chronic history of UTI and recently E. Coli UTI. Denies any urinary symptoms today and completed all medication. No other health issues today.   Patient's last menstrual period was 08/01/2006.          Sexually active: Yes.    The current method of family planning is tubal ligation.    Exercising: Yes.    stretching Smoker:  no  Health Maintenance: Pap:  11-18-13 neg MMG: 08-29-14 category b density,birads 1:neg Colonoscopy:  2010 neg BMD:   2011 TDaP:  2008 Labs: urine culture sent for toc urine Self breast exam: done occ   reports that she has never smoked. She has never used smokeless tobacco. She reports that she does not drink alcohol or use illicit drugs.  Past Medical History  Diagnosis Date  . Arthritis   . CLL (chronic lymphocytic leukemia)     Rai stage 0.  . IBS (irritable bowel syndrome)   . Rheumatoid arthritis(714.0)   . Hypertension   . Depression   . Osteopenia     Past Surgical History  Procedure Laterality Date  . Colonoscopy w/ polypectomy      Pre-cancerous polyps removed.  . Endometrial biopsy      8/98  . Tubal ligation    . Cesarean section      Current Outpatient Prescriptions  Medication Sig Dispense Refill  . cetirizine (ZYRTEC) 10 MG tablet Take 10 mg by mouth as needed.    . fluticasone (FLONASE) 50 MCG/ACT nasal spray Place into both nostrils daily as needed for allergies or rhinitis.    . GuaiFENesin (MUCINEX PO) Take by mouth as needed.    . hydrochlorothiazide (HYDRODIURIL) 12.5 MG tablet Take 12.5 mg by mouth daily.    Marland Kitchen ibuprofen (ADVIL,MOTRIN) 200 MG tablet Take 200 mg by  mouth as needed.    . montelukast (SINGULAIR) 10 MG tablet Take 10 mg by mouth daily as needed.   2  . Pseudoephedrine HCl (SUDAFED PO) Take by mouth as needed.    Marland Kitchen VAGIFEM 10 MCG TABS vaginal tablet INSERT 1 TABLET VAGINALLY TWICE A WEEK AS DIRECTED 24 tablet 3   No current facility-administered medications for this visit.    Family History  Problem Relation Age of Onset  . Colon cancer Mother   . Stroke Father   . Diabetes Father   . Heart disease Father   . Hypertension Father   . Heart disease Maternal Grandmother   . Diabetes Maternal Grandmother   . Cancer Paternal Grandmother     female cancer  . Emphysema Maternal Grandfather   . Breast cancer Cousin     passed away age 63    ROS:  Pertinent items are noted in HPI.  Otherwise, a comprehensive ROS was negative.  Exam:   BP 94/60 mmHg  Pulse 68  Resp 16  Ht 5' 5.25" (1.657 m)  Wt 144 lb (65.318 kg)  BMI 23.79 kg/m2  LMP 08/01/2006 Height: 5' 5.25" (165.7 cm) Ht Readings from Last 3 Encounters:  11/24/14 5' 5.25" (1.657 m)  10/28/14 5\' 5"  (1.651 m)  08/25/14 5\' 5"  (1.651 m)    General appearance: alert, cooperative and appears  stated age Head: Normocephalic, without obvious abnormality, atraumatic Neck: no adenopathy, supple, symmetrical, trachea midline and thyroid normal to inspection and palpation Lungs: clear to auscultation bilaterally CVAT bilateral negative Breasts: normal appearance, no masses or tenderness, No nipple retraction or dimpling, No nipple discharge or bleeding, No axillary or supraclavicular adenopathy Heart: regular rate and rhythm Abdomen: soft, non-tender; no masses,  no organomegaly, negative suprapubic tenderness Extremities: extremities normal, atraumatic, no cyanosis or edema Skin: Skin color, texture, turgor normal. No rashes or lesions Lymph nodes: Cervical, supraclavicular, and axillary nodes normal. No abnormal inguinal nodes palpated Neurologic: Grossly normal   Pelvic:  External genitalia:  no lesions              Urethra:  normal appearing urethra with no masses, tenderness or lesions, bladder and urethral meatus non tender              Bartholin's and Skene's: normal                 Vagina: normal appearing vagina with normal color and discharge, no lesions              Cervix: normal, non tender, no lesions              Pap taken: No. Bimanual Exam:  Uterus:  normal size, contour, position, consistency, mobility, non-tender              Adnexa: normal adnexa and no mass, fullness, tenderness               Rectovaginal: Confirms               Anus:  normal sphincter tone, no lesions    A:  Well Woman with normal exam  Menopausal no HRT  Atrophic vaginitis using Vagifem  Recent E.Coli UTI history of chronic, TOC today  Hypertension and chronic leukemia with MD management   P:   Reviewed health and wellness pertinent to exam  Aware of need to evaluate if vaginal bleeding  Rx Vagifem see order  Lab Urine culture  Continue with MD follow up as indicated  Pap smear not taken today   counseled on breast self exam, mammography screening, osteoporosis, BMD due, patient will schedule adequate intake of calcium and vitamin D, diet and exercise  return annually or prn  An After Visit Summary was printed and given to the patient.

## 2014-11-25 LAB — URINE CULTURE: Colony Count: 7000

## 2014-11-25 NOTE — Progress Notes (Signed)
Reviewed personally.  M. Suzanne Iness Pangilinan, MD.  

## 2014-12-01 ENCOUNTER — Other Ambulatory Visit: Payer: Self-pay | Admitting: Family Medicine

## 2014-12-01 DIAGNOSIS — R9389 Abnormal findings on diagnostic imaging of other specified body structures: Secondary | ICD-10-CM

## 2014-12-05 ENCOUNTER — Ambulatory Visit
Admission: RE | Admit: 2014-12-05 | Discharge: 2014-12-05 | Disposition: A | Discharge status: Home / Self Care | Payer: Medicare Other | Source: Ambulatory Visit | Attending: Family Medicine | Admitting: Family Medicine

## 2014-12-05 DIAGNOSIS — I6523 Occlusion and stenosis of bilateral carotid arteries: Secondary | ICD-10-CM | POA: Diagnosis not present

## 2014-12-05 DIAGNOSIS — R0989 Other specified symptoms and signs involving the circulatory and respiratory systems: Secondary | ICD-10-CM | POA: Diagnosis not present

## 2014-12-05 DIAGNOSIS — R9389 Abnormal findings on diagnostic imaging of other specified body structures: Secondary | ICD-10-CM

## 2015-02-27 DIAGNOSIS — H2513 Age-related nuclear cataract, bilateral: Secondary | ICD-10-CM | POA: Diagnosis not present

## 2015-02-27 DIAGNOSIS — H52203 Unspecified astigmatism, bilateral: Secondary | ICD-10-CM | POA: Diagnosis not present

## 2015-02-27 DIAGNOSIS — H3531 Nonexudative age-related macular degeneration: Secondary | ICD-10-CM | POA: Diagnosis not present

## 2015-02-27 DIAGNOSIS — H1789 Other corneal scars and opacities: Secondary | ICD-10-CM | POA: Diagnosis not present

## 2015-03-23 DIAGNOSIS — J45909 Unspecified asthma, uncomplicated: Secondary | ICD-10-CM | POA: Diagnosis not present

## 2015-03-23 DIAGNOSIS — J329 Chronic sinusitis, unspecified: Secondary | ICD-10-CM | POA: Diagnosis not present

## 2015-05-11 DIAGNOSIS — H6590 Unspecified nonsuppurative otitis media, unspecified ear: Secondary | ICD-10-CM | POA: Diagnosis not present

## 2015-05-11 DIAGNOSIS — J309 Allergic rhinitis, unspecified: Secondary | ICD-10-CM | POA: Diagnosis not present

## 2015-05-18 DIAGNOSIS — J320 Chronic maxillary sinusitis: Secondary | ICD-10-CM | POA: Diagnosis not present

## 2015-05-19 DIAGNOSIS — H9041 Sensorineural hearing loss, unilateral, right ear, with unrestricted hearing on the contralateral side: Secondary | ICD-10-CM | POA: Diagnosis not present

## 2015-05-19 DIAGNOSIS — J410 Simple chronic bronchitis: Secondary | ICD-10-CM | POA: Diagnosis not present

## 2015-05-19 DIAGNOSIS — J040 Acute laryngitis: Secondary | ICD-10-CM | POA: Diagnosis not present

## 2015-05-19 DIAGNOSIS — J3081 Allergic rhinitis due to animal (cat) (dog) hair and dander: Secondary | ICD-10-CM | POA: Diagnosis not present

## 2015-05-19 DIAGNOSIS — J320 Chronic maxillary sinusitis: Secondary | ICD-10-CM | POA: Diagnosis not present

## 2015-05-19 DIAGNOSIS — J301 Allergic rhinitis due to pollen: Secondary | ICD-10-CM | POA: Diagnosis not present

## 2015-05-19 DIAGNOSIS — J322 Chronic ethmoidal sinusitis: Secondary | ICD-10-CM | POA: Diagnosis not present

## 2015-05-27 DIAGNOSIS — J320 Chronic maxillary sinusitis: Secondary | ICD-10-CM | POA: Diagnosis not present

## 2015-05-27 DIAGNOSIS — H9041 Sensorineural hearing loss, unilateral, right ear, with unrestricted hearing on the contralateral side: Secondary | ICD-10-CM | POA: Diagnosis not present

## 2015-05-27 DIAGNOSIS — J040 Acute laryngitis: Secondary | ICD-10-CM | POA: Diagnosis not present

## 2015-05-27 DIAGNOSIS — H6062 Unspecified chronic otitis externa, left ear: Secondary | ICD-10-CM | POA: Diagnosis not present

## 2015-05-27 DIAGNOSIS — J322 Chronic ethmoidal sinusitis: Secondary | ICD-10-CM | POA: Diagnosis not present

## 2015-05-27 DIAGNOSIS — H6122 Impacted cerumen, left ear: Secondary | ICD-10-CM | POA: Diagnosis not present

## 2015-06-03 DIAGNOSIS — J3081 Allergic rhinitis due to animal (cat) (dog) hair and dander: Secondary | ICD-10-CM | POA: Diagnosis not present

## 2015-06-03 DIAGNOSIS — H9041 Sensorineural hearing loss, unilateral, right ear, with unrestricted hearing on the contralateral side: Secondary | ICD-10-CM | POA: Diagnosis not present

## 2015-06-03 DIAGNOSIS — H9311 Tinnitus, right ear: Secondary | ICD-10-CM | POA: Diagnosis not present

## 2015-06-03 DIAGNOSIS — H6062 Unspecified chronic otitis externa, left ear: Secondary | ICD-10-CM | POA: Diagnosis not present

## 2015-06-03 DIAGNOSIS — J301 Allergic rhinitis due to pollen: Secondary | ICD-10-CM | POA: Diagnosis not present

## 2015-06-09 DIAGNOSIS — Z23 Encounter for immunization: Secondary | ICD-10-CM | POA: Diagnosis not present

## 2015-07-31 ENCOUNTER — Other Ambulatory Visit: Payer: Self-pay

## 2015-07-31 DIAGNOSIS — Z1231 Encounter for screening mammogram for malignant neoplasm of breast: Secondary | ICD-10-CM

## 2015-08-31 ENCOUNTER — Ambulatory Visit
Admission: RE | Admit: 2015-08-31 | Discharge: 2015-08-31 | Disposition: A | Discharge status: Home / Self Care | Payer: Medicare Other | Source: Ambulatory Visit

## 2015-08-31 DIAGNOSIS — Z1231 Encounter for screening mammogram for malignant neoplasm of breast: Secondary | ICD-10-CM | POA: Diagnosis not present

## 2015-11-09 DIAGNOSIS — Z86010 Personal history of colonic polyps: Secondary | ICD-10-CM | POA: Diagnosis not present

## 2015-11-09 DIAGNOSIS — Z800 Family history of malignant neoplasm of digestive organs: Secondary | ICD-10-CM | POA: Diagnosis not present

## 2015-12-01 ENCOUNTER — Ambulatory Visit (INDEPENDENT_AMBULATORY_CARE_PROVIDER_SITE_OTHER): Payer: Medicare Other | Admitting: Certified Nurse Midwife

## 2015-12-01 ENCOUNTER — Encounter: Payer: Self-pay | Admitting: Certified Nurse Midwife

## 2015-12-01 VITALS — BP 120/64 | HR 70 | Resp 16 | Ht 65.25 in | Wt 145.0 lb

## 2015-12-01 DIAGNOSIS — N951 Menopausal and female climacteric states: Secondary | ICD-10-CM

## 2015-12-01 DIAGNOSIS — Z124 Encounter for screening for malignant neoplasm of cervix: Secondary | ICD-10-CM

## 2015-12-01 DIAGNOSIS — Z01419 Encounter for gynecological examination (general) (routine) without abnormal findings: Secondary | ICD-10-CM | POA: Diagnosis not present

## 2015-12-01 NOTE — Progress Notes (Signed)
70 y.o. G3P3 & 1 deceased Married white  Caucasian Fe here for annual exam. Menopausal no HRT. Denies vaginal dryness or bleeding. Continues with coconut oil for dryness, works so well. No problems with UTI, since using coconut oil. Exercising daily, ambulating well, no issues. Has noted some swelling of the top of left foot, no injury.Plans to see PCP for unless changes. Sees Dr. Drema Dallas yearly for labs/hypertension management, [place], which is stable and aex. No medication changes. No other health issues today. Yolanda Bonine will starting med school at [employer]!  Patient's last menstrual period was 08/01/2006.          Sexually active: Yes.    The current method of family planning is tubal ligation.    Exercising: Yes.    walking & stretching Smoker:  no  Health Maintenance: Pap:  11-18-13 neg MMG:  08-31-15 category c density,birads 1:neg Colonoscopy: 2017 neg f/u 55yrs BMD:  2011 TDaP:  2008 Shingles: no Pneumonia: 2016 Hep C and HIV: not done Labs: pcp Self breast exam: done occ   reports that she has never smoked. She has never used smokeless tobacco. She reports that she does not drink alcohol or use illicit drugs.  Past Medical History  Diagnosis Date  . Arthritis   . CLL (chronic lymphocytic leukemia) (HCC)     Rai stage 0.  . IBS (irritable bowel syndrome)   . Rheumatoid arthritis(714.0)   . Hypertension   . Depression   . Osteopenia     Past Surgical History  Procedure Laterality Date  . Colonoscopy w/ polypectomy      Pre-cancerous polyps removed.  . Endometrial biopsy      8/98  . Tubal ligation    . Cesarean section      Current Outpatient Prescriptions  Medication Sig Dispense Refill  . cetirizine (ZYRTEC) 10 MG tablet Take 10 mg by mouth as needed.    . fluticasone (FLONASE) 50 MCG/ACT nasal spray Place into both nostrils daily as needed for allergies or rhinitis.    . hydrochlorothiazide (HYDRODIURIL) 12.5 MG tablet Take 12.5 mg by mouth daily.    Marland Kitchen ibuprofen  (ADVIL,MOTRIN) 200 MG tablet Take 200 mg by mouth as needed.    . Pseudoephedrine HCl (SUDAFED PO) Take by mouth as needed.     No current facility-administered medications for this visit.    Family History  Problem Relation Age of Onset  . Colon cancer Mother   . Stroke Father   . Diabetes Father   . Heart disease Father   . Hypertension Father   . Heart disease Maternal Grandmother   . Diabetes Maternal Grandmother   . Cancer Paternal Grandmother     female cancer  . Emphysema Maternal Grandfather   . Breast cancer Cousin     passed away age 68    ROS:  Pertinent items are noted in HPI.  Otherwise, a comprehensive ROS was negative.  Exam:   BP 120/64 mmHg  Pulse 70  Resp 16  Ht 5' 5.25" (1.657 m)  Wt 145 lb (65.772 kg)  BMI 23.95 kg/m2  LMP 08/01/2006 Height: 5' 5.25" (165.7 cm) Ht Readings from Last 3 Encounters:  12/01/15 5' 5.25" (1.657 m)  11/24/14 5' 5.25" (1.657 m)  10/28/14 5\' 5"  (1.651 m)    General appearance: alert, cooperative and appears stated age Head: Normocephalic, without obvious abnormality, atraumatic Neck: no adenopathy, supple, symmetrical, trachea midline and thyroid normal to inspection and palpation Lungs: clear to auscultation bilaterally Breasts: normal appearance,  no masses or tenderness, No nipple retraction or dimpling, No nipple discharge or bleeding, No axillary or supraclavicular adenopathy Heart: regular rate and rhythm Abdomen: soft, non-tender; no masses,  no organomegaly Extremities: extremities normal, atraumatic, no cyanosis or edema, top of left foot,very slight edema, non tender,no discoloration or pain with moverment Skin: Skin color, texture, turgor normal. No rashes or lesions Lymph nodes: Cervical, supraclavicular, and axillary nodes normal. No abnormal inguinal nodes palpated Neurologic: Grossly normal   Pelvic: External genitalia:  no lesions              Urethra:  normal appearing urethra with no masses, tenderness  or lesions              Bartholin's and Skene's: normal                 Vagina: normal appearing vagina with normal color and discharge, no lesions              Cervix: multiparous appearance, no cervical motion tenderness, no lesions and scant blood with pap only              Pap taken: Yes.   Bimanual Exam:  Uterus:  normal size, contour, position, consistency, mobility, non-tender and anteverted              Adnexa: normal adnexa and no mass, fullness, tenderness               Rectovaginal: Confirms               Anus:  normal sphincter tone, no lesions  Chaperone present: yes  A:  Well Woman with normal exam  Menopausal no HRT  Hypertension/HCC/Vitamin D deficiency with PCP management  Left foot minimal edema, plans to see PCP regarding  BMD due  P:   Reviewed health and wellness pertinent to exam  Aware of need to evaluate if vaginal bleeding  Continue follow up with PCP as indicated  Will schedule after seeing PCP  Pap smear as above   counseled on breast self exam, mammography screening, adequate intake of calcium and vitamin D, diet and exercise, Kegel's exercises  return annually or prn  An After Visit Summary was printed and given to the patient.

## 2015-12-01 NOTE — Patient Instructions (Signed)

## 2015-12-07 ENCOUNTER — Other Ambulatory Visit: Payer: Self-pay | Admitting: *Deleted

## 2015-12-07 MED ORDER — ESTRADIOL 10 MCG VA TABS: 1.0000 | ORAL_TABLET | VAGINAL | Status: DC

## 2015-12-07 NOTE — Telephone Encounter (Signed)
DL did you want her to take Vagifem - no mention in your note?

## 2015-12-07 NOTE — Telephone Encounter (Signed)
[facility] [address]  Medication refill request: Vagifem 10 mcg  Last AEX: 12/01/15 with DL  Next AEX: 12/01/2016 with DL Last MMG (if hormonal medication request): 08/31/2015 with DL  Refill authorized: #[id number]  Routed to [facility] since DL is out of office today.

## 2015-12-08 DIAGNOSIS — M2041 Other hammer toe(s) (acquired), right foot: Secondary | ICD-10-CM | POA: Diagnosis not present

## 2015-12-08 DIAGNOSIS — M79672 Pain in left foot: Secondary | ICD-10-CM | POA: Diagnosis not present

## 2015-12-08 DIAGNOSIS — M19072 Primary osteoarthritis, left ankle and foot: Secondary | ICD-10-CM | POA: Diagnosis not present

## 2015-12-08 DIAGNOSIS — M2042 Other hammer toe(s) (acquired), left foot: Secondary | ICD-10-CM | POA: Diagnosis not present

## 2015-12-09 DIAGNOSIS — R8290 Unspecified abnormal findings in urine: Secondary | ICD-10-CM | POA: Diagnosis not present

## 2015-12-09 DIAGNOSIS — N390 Urinary tract infection, site not specified: Secondary | ICD-10-CM | POA: Diagnosis not present

## 2015-12-09 DIAGNOSIS — R509 Fever, unspecified: Secondary | ICD-10-CM | POA: Diagnosis not present

## 2015-12-09 LAB — IPS PAP SMEAR ONLY

## 2015-12-09 NOTE — Progress Notes (Signed)
Reviewed personally.  M. Suzanne Shavonta Gossen, MD.  

## 2015-12-18 DIAGNOSIS — R8290 Unspecified abnormal findings in urine: Secondary | ICD-10-CM | POA: Diagnosis not present

## 2015-12-18 DIAGNOSIS — I10 Essential (primary) hypertension: Secondary | ICD-10-CM | POA: Diagnosis not present

## 2015-12-18 DIAGNOSIS — R509 Fever, unspecified: Secondary | ICD-10-CM | POA: Diagnosis not present

## 2015-12-18 DIAGNOSIS — Z0000 Encounter for general adult medical examination without abnormal findings: Secondary | ICD-10-CM | POA: Diagnosis not present

## 2015-12-18 DIAGNOSIS — C9110 Chronic lymphocytic leukemia of B-cell type not having achieved remission: Secondary | ICD-10-CM | POA: Diagnosis not present

## 2015-12-18 DIAGNOSIS — E7800 Pure hypercholesterolemia, unspecified: Secondary | ICD-10-CM | POA: Diagnosis not present

## 2015-12-18 DIAGNOSIS — E559 Vitamin D deficiency, unspecified: Secondary | ICD-10-CM | POA: Diagnosis not present

## 2015-12-22 DIAGNOSIS — Z1389 Encounter for screening for other disorder: Secondary | ICD-10-CM | POA: Diagnosis not present

## 2015-12-22 DIAGNOSIS — I10 Essential (primary) hypertension: Secondary | ICD-10-CM | POA: Diagnosis not present

## 2015-12-22 DIAGNOSIS — C9110 Chronic lymphocytic leukemia of B-cell type not having achieved remission: Secondary | ICD-10-CM | POA: Diagnosis not present

## 2015-12-22 DIAGNOSIS — E7800 Pure hypercholesterolemia, unspecified: Secondary | ICD-10-CM | POA: Diagnosis not present

## 2015-12-22 DIAGNOSIS — E559 Vitamin D deficiency, unspecified: Secondary | ICD-10-CM | POA: Diagnosis not present

## 2015-12-22 DIAGNOSIS — Z0000 Encounter for general adult medical examination without abnormal findings: Secondary | ICD-10-CM | POA: Diagnosis not present

## 2015-12-22 DIAGNOSIS — M0609 Rheumatoid arthritis without rheumatoid factor, multiple sites: Secondary | ICD-10-CM | POA: Diagnosis not present

## 2015-12-29 DIAGNOSIS — H6521 Chronic serous otitis media, right ear: Secondary | ICD-10-CM | POA: Diagnosis not present

## 2015-12-29 DIAGNOSIS — H902 Conductive hearing loss, unspecified: Secondary | ICD-10-CM | POA: Diagnosis not present

## 2015-12-29 DIAGNOSIS — J301 Allergic rhinitis due to pollen: Secondary | ICD-10-CM | POA: Diagnosis not present

## 2015-12-29 DIAGNOSIS — J322 Chronic ethmoidal sinusitis: Secondary | ICD-10-CM | POA: Diagnosis not present

## 2015-12-29 DIAGNOSIS — J410 Simple chronic bronchitis: Secondary | ICD-10-CM | POA: Diagnosis not present

## 2015-12-29 DIAGNOSIS — R490 Dysphonia: Secondary | ICD-10-CM | POA: Diagnosis not present

## 2015-12-29 DIAGNOSIS — J320 Chronic maxillary sinusitis: Secondary | ICD-10-CM | POA: Diagnosis not present

## 2015-12-29 DIAGNOSIS — H9041 Sensorineural hearing loss, unilateral, right ear, with unrestricted hearing on the contralateral side: Secondary | ICD-10-CM | POA: Diagnosis not present

## 2016-01-12 DIAGNOSIS — J301 Allergic rhinitis due to pollen: Secondary | ICD-10-CM | POA: Diagnosis not present

## 2016-01-12 DIAGNOSIS — J322 Chronic ethmoidal sinusitis: Secondary | ICD-10-CM | POA: Diagnosis not present

## 2016-01-12 DIAGNOSIS — H9041 Sensorineural hearing loss, unilateral, right ear, with unrestricted hearing on the contralateral side: Secondary | ICD-10-CM | POA: Diagnosis not present

## 2016-01-12 DIAGNOSIS — J320 Chronic maxillary sinusitis: Secondary | ICD-10-CM | POA: Diagnosis not present

## 2016-01-12 DIAGNOSIS — H9311 Tinnitus, right ear: Secondary | ICD-10-CM | POA: Diagnosis not present

## 2016-01-12 DIAGNOSIS — J040 Acute laryngitis: Secondary | ICD-10-CM | POA: Diagnosis not present

## 2016-01-12 DIAGNOSIS — H9071 Mixed conductive and sensorineural hearing loss, unilateral, right ear, with unrestricted hearing on the contralateral side: Secondary | ICD-10-CM | POA: Diagnosis not present

## 2016-04-13 DIAGNOSIS — Z23 Encounter for immunization: Secondary | ICD-10-CM | POA: Diagnosis not present

## 2016-05-11 DIAGNOSIS — H2511 Age-related nuclear cataract, right eye: Secondary | ICD-10-CM | POA: Diagnosis not present

## 2016-05-11 DIAGNOSIS — H25011 Cortical age-related cataract, right eye: Secondary | ICD-10-CM | POA: Diagnosis not present

## 2016-05-11 DIAGNOSIS — H16401 Unspecified corneal neovascularization, right eye: Secondary | ICD-10-CM | POA: Diagnosis not present

## 2016-05-11 DIAGNOSIS — H3552 Pigmentary retinal dystrophy: Secondary | ICD-10-CM | POA: Diagnosis not present

## 2016-05-18 DIAGNOSIS — H6121 Impacted cerumen, right ear: Secondary | ICD-10-CM | POA: Diagnosis not present

## 2016-05-18 DIAGNOSIS — H6061 Unspecified chronic otitis externa, right ear: Secondary | ICD-10-CM | POA: Diagnosis not present

## 2016-05-25 DIAGNOSIS — H6061 Unspecified chronic otitis externa, right ear: Secondary | ICD-10-CM | POA: Diagnosis not present

## 2016-05-25 DIAGNOSIS — H6121 Impacted cerumen, right ear: Secondary | ICD-10-CM | POA: Diagnosis not present

## 2016-07-27 ENCOUNTER — Other Ambulatory Visit: Payer: Self-pay | Admitting: Certified Nurse Midwife

## 2016-07-27 DIAGNOSIS — J040 Acute laryngitis: Secondary | ICD-10-CM | POA: Diagnosis not present

## 2016-07-27 DIAGNOSIS — Q105 Congenital stenosis and stricture of lacrimal duct: Secondary | ICD-10-CM | POA: Diagnosis not present

## 2016-07-27 DIAGNOSIS — Z1231 Encounter for screening mammogram for malignant neoplasm of breast: Secondary | ICD-10-CM

## 2016-08-25 ENCOUNTER — Telehealth: Payer: Self-pay | Admitting: Certified Nurse Midwife

## 2016-08-25 NOTE — Telephone Encounter (Signed)
Patient is having trouble getting her Vagifem covered through her insurance. The pharmacy told her she will need to get prior authorization from her insurance. Confirmed pharmacy on file.

## 2016-08-25 NOTE — Telephone Encounter (Signed)
[facility] states that the patient picked up RX on 08/13/2016 for $81 and this was processed with insurance. Per tech it appears this is applying towards a deductible with the patient's insurance. Patient will need to check with insurance regarding cost and alternatives if needed.

## 2016-08-26 NOTE — Telephone Encounter (Signed)
Patient returning your call.

## 2016-08-26 NOTE — Telephone Encounter (Signed)
Call to patient. Advised of [facility]. Patient states she received a letter from [place] saying medication is not covered and requires PA. Advised I will contact her insurance to obtain forms for completion and return call with further information. Patient is agreeable.Call to Medicare. Transferred to [place]. Transferred to PA department. Spoke with [employer] PA to be faxed to the office for completion.

## 2016-08-26 NOTE — Telephone Encounter (Signed)
Left message to call Kaitlyn at [phone]. 

## 2016-08-30 NOTE — Telephone Encounter (Signed)
PA for Vagifem has been completed and signed by [name] CNM. This has been faxed to [place] at [phone] with cover sheet and confirmation.  Left message to call [place] at [phone]. Need to advise PA has been sent to [place] and she will be contacted as soon as further information is received from the insurance company.

## 2016-09-01 ENCOUNTER — Ambulatory Visit: Payer: Medicare Other

## 2016-09-01 NOTE — Telephone Encounter (Signed)
Spoke with patient. Advised RN has received notification from [place] that PA for Vagifem has been approved from 06/01/2016-08/30/2017. Form to scan into EPIC. Patient is agreeable and verbalizes understanding.  Routing to provider for final review. Patient agreeable to disposition. Will close encounter.

## 2016-09-12 DIAGNOSIS — R05 Cough: Secondary | ICD-10-CM | POA: Diagnosis not present

## 2016-09-12 DIAGNOSIS — J329 Chronic sinusitis, unspecified: Secondary | ICD-10-CM | POA: Diagnosis not present

## 2016-09-16 DIAGNOSIS — M79672 Pain in left foot: Secondary | ICD-10-CM | POA: Diagnosis not present

## 2016-09-16 DIAGNOSIS — M79671 Pain in right foot: Secondary | ICD-10-CM | POA: Diagnosis not present

## 2016-09-27 ENCOUNTER — Ambulatory Visit
Admission: RE | Admit: 2016-09-27 | Discharge: 2016-09-27 | Disposition: A | Discharge status: Home / Self Care | Payer: Medicare Other | Source: Ambulatory Visit | Attending: Certified Nurse Midwife | Admitting: Certified Nurse Midwife

## 2016-09-27 DIAGNOSIS — Z1231 Encounter for screening mammogram for malignant neoplasm of breast: Secondary | ICD-10-CM

## 2016-11-21 DIAGNOSIS — H04123 Dry eye syndrome of bilateral lacrimal glands: Secondary | ICD-10-CM | POA: Diagnosis not present

## 2016-11-21 DIAGNOSIS — H1852 Epithelial (juvenile) corneal dystrophy: Secondary | ICD-10-CM | POA: Diagnosis not present

## 2016-11-21 DIAGNOSIS — M3501 Sicca syndrome with keratoconjunctivitis: Secondary | ICD-10-CM | POA: Diagnosis not present

## 2016-12-01 ENCOUNTER — Ambulatory Visit (INDEPENDENT_AMBULATORY_CARE_PROVIDER_SITE_OTHER): Payer: Medicare Other | Admitting: Certified Nurse Midwife

## 2016-12-01 ENCOUNTER — Encounter: Payer: Self-pay | Admitting: Certified Nurse Midwife

## 2016-12-01 VITALS — BP 110/60 | HR 68 | Resp 16 | Ht 65.25 in | Wt 144.0 lb

## 2016-12-01 DIAGNOSIS — Z124 Encounter for screening for malignant neoplasm of cervix: Secondary | ICD-10-CM | POA: Diagnosis not present

## 2016-12-01 DIAGNOSIS — Z01419 Encounter for gynecological examination (general) (routine) without abnormal findings: Secondary | ICD-10-CM | POA: Diagnosis not present

## 2016-12-01 DIAGNOSIS — N952 Postmenopausal atrophic vaginitis: Secondary | ICD-10-CM

## 2016-12-01 DIAGNOSIS — C9110 Chronic lymphocytic leukemia of B-cell type not having achieved remission: Secondary | ICD-10-CM

## 2016-12-01 DIAGNOSIS — C9190 Lymphoid leukemia, unspecified not having achieved remission: Secondary | ICD-10-CM

## 2016-12-01 DIAGNOSIS — Z23 Encounter for immunization: Secondary | ICD-10-CM

## 2016-12-01 MED ORDER — ESTRADIOL 10 MCG VA TABS: 1.0000 | ORAL_TABLET | VAGINAL | 12 refills | Status: DC

## 2016-12-01 MED ORDER — TETANUS-DIPHTH-ACELL PERTUSSIS 5-2.5-18.5 LF-MCG/0.5 IM SUSP: 0.5000 mL | Freq: Once | INTRAMUSCULAR | Status: DC

## 2016-12-01 NOTE — Progress Notes (Signed)
71 y.o. G3P3 Married  Caucasian Fe here for annual exam. Menopausal no HRT. Denies vaginal dryness or vaginal bleeding.  Vagifem working well for vaginal dryness, uses only once a week and uses coconut oil for moisture also. Oncology has released her with lymphatic leukemia( chronic) due to change, so PCP manages labs and HCTZ medication. Also yearly aex. No health issues today. Busy with grandchildren..  Patient's last menstrual period was 08/01/2006.          Sexually active: Yes.    The current method of family planning is tubal ligation.    Exercising: Yes.    walking Smoker:  no  Health Maintenance: Pap:  11-18-13 neg, 12-08-15 neg no endos-atrophy History of Abnormal Pap: ? Repeats before MMG:  09-27-16 category b density birads 1:neg Self Breast exams: occ Colonoscopy:  2017 neg f/u 26yrs BMD:   2011 TDaP:  2008 Shingles: not done Pneumonia: 2016 Hep C and HIV: not done Labs: pcp   reports that she has never smoked. She has never used smokeless tobacco. She reports that she does not drink alcohol or use drugs.  Past Medical History:  Diagnosis Date  . Arthritis   . CLL (chronic lymphocytic leukemia) (HCC)    Rai stage 0.  . Depression   . Hypertension   . IBS (irritable bowel syndrome)   . Osteopenia   . Rheumatoid arthritis(714.0)     Past Surgical History:  Procedure Laterality Date  . CESAREAN SECTION    . COLONOSCOPY W/ POLYPECTOMY     Pre-cancerous polyps removed.  . ENDOMETRIAL BIOPSY     8/98  . TUBAL LIGATION      Current Outpatient Prescriptions  Medication Sig Dispense Refill  . cetirizine (ZYRTEC) 10 MG tablet Take 10 mg by mouth as needed.    . Estradiol 10 MCG TABS vaginal tablet Place 1 tablet (10 mcg total) vaginally 2 (two) times a week. 24 tablet 2  . fluorometholone (FML) 0.1 % ophthalmic suspension     . hydrochlorothiazide (HYDRODIURIL) 12.5 MG tablet Take 12.5 mg by mouth daily.    Marland Kitchen ibuprofen (ADVIL,MOTRIN) 200 MG tablet Take 200 mg by mouth  as needed.    . RESTASIS MULTIDOSE 0.05 % ophthalmic emulsion      No current facility-administered medications for this visit.     Family History  Problem Relation Age of Onset  . Colon cancer Mother   . Stroke Father   . Diabetes Father   . Heart disease Father   . Hypertension Father   . Heart disease Maternal Grandmother   . Diabetes Maternal Grandmother   . Cancer Paternal Grandmother     female cancer  . Emphysema Maternal Grandfather   . Breast cancer Cousin     passed away age 64    ROS:  Pertinent items are noted in HPI.  Otherwise, a comprehensive ROS was negative.  Exam:   BP 110/60   Pulse 68   Resp 16   Ht 5' 5.25" (1.657 m)   Wt 144 lb (65.3 kg)   LMP 08/01/2006   BMI 23.78 kg/m  Height: 5' 5.25" (165.7 cm) Ht Readings from Last 3 Encounters:  12/01/16 5' 5.25" (1.657 m)  12/01/15 5' 5.25" (1.657 m)  11/24/14 5' 5.25" (1.657 m)    General appearance: alert, cooperative and appears stated age Head: Normocephalic, without obvious abnormality, atraumatic Neck: no adenopathy, supple, symmetrical, trachea midline and thyroid normal to inspection and palpation Lungs: clear to auscultation bilaterally Breasts: normal  appearance, no masses or tenderness, No nipple retraction or dimpling, No nipple discharge or bleeding, No axillary or supraclavicular adenopathy Heart: regular rate and rhythm Abdomen: soft, non-tender; no masses,  no organomegaly Extremities: extremities normal, atraumatic, no cyanosis or edema Skin: Skin color, texture, turgor normal. No rashes or lesions Lymph nodes: Cervical, supraclavicular, and axillary nodes normal. No abnormal inguinal nodes palpated Neurologic: Grossly normal   Pelvic: External genitalia:  no lesions              Urethra:  normal appearing urethra with no masses, tenderness or lesions              Bartholin's and Skene's: normal                 Vagina: normal appearing vagina with normal color and discharge, no  lesions              Cervix: no bleeding following Pap, no cervical motion tenderness and no lesions              Pap taken: Yes.   Bimanual Exam:  Uterus:  normal size, contour, position, consistency, mobility, non-tender              Adnexa: normal adnexa and no mass, fullness, tenderness               Rectovaginal: Confirms               Anus:  normal sphincter tone, no lesions  Chaperone present: yes  A:  Well Woman with normal exam  Post menopausal no HRT  Atrophic Vaginitis uses Vagifem with good results  PCP management of HCTZ and leukemia labs  Immunization update  P:   Reviewed health and wellness pertinent to exam  Aware of need to advise if vaginal bleeding  Discussed risks and benefits of Vagifem, still wants to continue. Warning signs of use  Continue PCP follow up as indicated  Requests TDAP  Pap smear: yes   counseled on breast self exam, mammography screening, adequate intake of calcium and vitamin D, diet and exercise, Kegel's exercises  return annually or prn  An After Visit Summary was printed and given to the patient.

## 2016-12-01 NOTE — Patient Instructions (Signed)

## 2016-12-05 LAB — IPS PAP SMEAR ONLY

## 2016-12-20 DIAGNOSIS — E559 Vitamin D deficiency, unspecified: Secondary | ICD-10-CM | POA: Diagnosis not present

## 2016-12-20 DIAGNOSIS — C9110 Chronic lymphocytic leukemia of B-cell type not having achieved remission: Secondary | ICD-10-CM | POA: Diagnosis not present

## 2016-12-20 DIAGNOSIS — E7800 Pure hypercholesterolemia, unspecified: Secondary | ICD-10-CM | POA: Diagnosis not present

## 2016-12-20 DIAGNOSIS — I10 Essential (primary) hypertension: Secondary | ICD-10-CM | POA: Diagnosis not present

## 2016-12-22 DIAGNOSIS — M5090 Cervical disc disorder, unspecified, unspecified cervical region: Secondary | ICD-10-CM | POA: Diagnosis not present

## 2016-12-22 DIAGNOSIS — C9110 Chronic lymphocytic leukemia of B-cell type not having achieved remission: Secondary | ICD-10-CM | POA: Diagnosis not present

## 2016-12-22 DIAGNOSIS — Z0000 Encounter for general adult medical examination without abnormal findings: Secondary | ICD-10-CM | POA: Diagnosis not present

## 2016-12-22 DIAGNOSIS — K635 Polyp of colon: Secondary | ICD-10-CM | POA: Diagnosis not present

## 2016-12-22 DIAGNOSIS — E559 Vitamin D deficiency, unspecified: Secondary | ICD-10-CM | POA: Diagnosis not present

## 2016-12-22 DIAGNOSIS — R748 Abnormal levels of other serum enzymes: Secondary | ICD-10-CM | POA: Diagnosis not present

## 2016-12-22 DIAGNOSIS — M0609 Rheumatoid arthritis without rheumatoid factor, multiple sites: Secondary | ICD-10-CM | POA: Diagnosis not present

## 2016-12-22 DIAGNOSIS — I10 Essential (primary) hypertension: Secondary | ICD-10-CM | POA: Diagnosis not present

## 2016-12-22 DIAGNOSIS — F439 Reaction to severe stress, unspecified: Secondary | ICD-10-CM | POA: Diagnosis not present

## 2016-12-22 DIAGNOSIS — E7800 Pure hypercholesterolemia, unspecified: Secondary | ICD-10-CM | POA: Diagnosis not present

## 2016-12-28 ENCOUNTER — Other Ambulatory Visit: Payer: Self-pay | Admitting: Hematology and Oncology

## 2016-12-28 ENCOUNTER — Other Ambulatory Visit: Payer: Self-pay | Admitting: Family Medicine

## 2016-12-28 DIAGNOSIS — R7989 Other specified abnormal findings of blood chemistry: Secondary | ICD-10-CM

## 2016-12-28 DIAGNOSIS — C9110 Chronic lymphocytic leukemia of B-cell type not having achieved remission: Secondary | ICD-10-CM

## 2016-12-28 DIAGNOSIS — R945 Abnormal results of liver function studies: Principal | ICD-10-CM

## 2016-12-29 ENCOUNTER — Telehealth: Payer: Self-pay | Admitting: *Deleted

## 2016-12-29 NOTE — Telephone Encounter (Signed)
I have sent scheduling msg 

## 2016-12-29 NOTE — Telephone Encounter (Signed)
Pt left message wanting to schedule an appt with Dr Alvy Bimler. Last seen 1/16

## 2017-01-03 ENCOUNTER — Telehealth: Payer: Self-pay | Admitting: Hematology and Oncology

## 2017-01-03 NOTE — Telephone Encounter (Signed)
Spoke with patient re lab/fu 6/25 @ 12:30 pm. Date/time per patient - not able to come 6/19 due to work schedule.

## 2017-01-04 ENCOUNTER — Ambulatory Visit
Admission: RE | Admit: 2017-01-04 | Discharge: 2017-01-04 | Disposition: A | Discharge status: Home / Self Care | Payer: Medicare Other | Source: Ambulatory Visit | Attending: Family Medicine | Admitting: Family Medicine

## 2017-01-04 DIAGNOSIS — R945 Abnormal results of liver function studies: Principal | ICD-10-CM

## 2017-01-04 DIAGNOSIS — R7989 Other specified abnormal findings of blood chemistry: Secondary | ICD-10-CM

## 2017-01-17 ENCOUNTER — Ambulatory Visit: Payer: Medicare Other | Admitting: Hematology and Oncology

## 2017-01-17 ENCOUNTER — Other Ambulatory Visit: Payer: Medicare Other

## 2017-01-23 ENCOUNTER — Other Ambulatory Visit (HOSPITAL_COMMUNITY)
Admission: RE | Admit: 2017-01-23 | Discharge: 2017-01-23 | Disposition: A | Discharge status: Home / Self Care | Payer: Medicare Other | Source: Ambulatory Visit | Attending: Hematology and Oncology | Admitting: Hematology and Oncology

## 2017-01-23 ENCOUNTER — Encounter: Payer: Self-pay | Admitting: Hematology and Oncology

## 2017-01-23 ENCOUNTER — Other Ambulatory Visit (HOSPITAL_BASED_OUTPATIENT_CLINIC_OR_DEPARTMENT_OTHER): Payer: Medicare Other

## 2017-01-23 ENCOUNTER — Telehealth: Payer: Self-pay | Admitting: Hematology and Oncology

## 2017-01-23 ENCOUNTER — Ambulatory Visit (HOSPITAL_BASED_OUTPATIENT_CLINIC_OR_DEPARTMENT_OTHER): Payer: Medicare Other | Admitting: Hematology and Oncology

## 2017-01-23 DIAGNOSIS — D72820 Lymphocytosis (symptomatic): Secondary | ICD-10-CM | POA: Diagnosis not present

## 2017-01-23 DIAGNOSIS — C9190 Lymphoid leukemia, unspecified not having achieved remission: Secondary | ICD-10-CM | POA: Diagnosis not present

## 2017-01-23 DIAGNOSIS — K760 Fatty (change of) liver, not elsewhere classified: Secondary | ICD-10-CM

## 2017-01-23 DIAGNOSIS — C9110 Chronic lymphocytic leukemia of B-cell type not having achieved remission: Secondary | ICD-10-CM | POA: Insufficient documentation

## 2017-01-23 LAB — CBC WITH DIFFERENTIAL/PLATELET
BASO%: 0.8 % (ref 0.0–2.0)
Basophils Absolute: 0.1 10*3/uL (ref 0.0–0.1)
EOS%: 1.5 % (ref 0.0–7.0)
Eosinophils Absolute: 0.1 10*3/uL (ref 0.0–0.5)
HCT: 36.7 % (ref 34.8–46.6)
HGB: 12.3 g/dL (ref 11.6–15.9)
LYMPH%: 65.9 % — ABNORMAL HIGH (ref 14.0–49.7)
MCH: 27.9 pg (ref 25.1–34.0)
MCHC: 33.5 g/dL (ref 31.5–36.0)
MCV: 83.2 fL (ref 79.5–101.0)
MONO#: 0.3 10*3/uL (ref 0.1–0.9)
MONO%: 4.4 % (ref 0.0–14.0)
NEUT#: 2.1 10*3/uL (ref 1.5–6.5)
NEUT%: 27.4 % — ABNORMAL LOW (ref 38.4–76.8)
Platelets: 236 10*3/uL (ref 145–400)
RBC: 4.41 10*6/uL (ref 3.70–5.45)
RDW: 14.6 % — ABNORMAL HIGH (ref 11.2–14.5)
WBC: 7.8 10*3/uL (ref 3.9–10.3)
lymph#: 5.1 10*3/uL — ABNORMAL HIGH (ref 0.9–3.3)

## 2017-01-23 LAB — TECHNOLOGIST REVIEW

## 2017-01-23 NOTE — Assessment & Plan Note (Signed)
She has diagnosis of CLL based on flow cytometry from 2005 I have repeated flow cytometry including fish prognostic marker for CLL I suspect she has possible deletion 13 q. given no evidence of disease progression after so many years I will continue to see her once a year with repeat history, physical examination and blood work Examination today showed no evidence of disease progression since last visit.  In fact, the absolute lymphocytosis has improved

## 2017-01-23 NOTE — Telephone Encounter (Signed)
Scheduled appt per 6/25 los - Gave patient AVS and calender per los. Lab and f/u in one year,.

## 2017-01-23 NOTE — Assessment & Plan Note (Signed)
She had recent abnormal liver enzymes Ultrasound showed fatty liver disease I recommend exercise as tolerated, weight loss and dietary modification

## 2017-01-23 NOTE — Progress Notes (Signed)
[facility] OFFICE PROGRESS NOTE  Patient Care Team: Leighton Ruff, MD as PCP - General (Family Medicine) Heath Lark, MD as Consulting Physician (Hematology and Oncology)  SUMMARY OF ONCOLOGIC HISTORY:   Chronic lymphatic leukemia ([place])   02/24/2004 Pathology Results     Case #: [id number] confirmed CLL       INTERVAL HISTORY: Please see below for problem oriented charting. She returns for further follow-up She had recent sickness after tetanus shot She felt better She had recent abnormal LFTs Ultrasound showed fatty liver disease She denies recent abnormal lymphadenopathy The patient denies any recent signs or symptoms of bleeding such as spontaneous epistaxis, hematuria or hematochezia. She is planning to retire soon in July  REVIEW OF SYSTEMS:   Constitutional: Denies fevers, chills or abnormal weight loss Eyes: Denies blurriness of vision Ears, nose, mouth, throat, and face: Denies mucositis or sore throat Respiratory: Denies cough, dyspnea or wheezes Cardiovascular: Denies palpitation, chest discomfort or lower extremity swelling Gastrointestinal:  Denies nausea, heartburn or change in bowel habits Skin: Denies abnormal skin rashes Lymphatics: Denies new lymphadenopathy or easy bruising Neurological:Denies numbness, tingling or new weaknesses Behavioral/Psych: Mood is stable, no new changes  All other systems were reviewed with the patient and are negative.  I have reviewed the past medical history, past surgical history, social history and family history with the patient and they are unchanged from previous note.  ALLERGIES:  is allergic to contrast media [iodinated diagnostic agents]; provera [medroxyprogesterone acetate]; and sulfa antibiotics.  MEDICATIONS:  Current Outpatient Prescriptions  Medication Sig Dispense Refill  . cetirizine (ZYRTEC) 10 MG tablet Take 10 mg by mouth as needed.    . Estradiol 10 MCG TABS vaginal tablet Place 1 tablet  (10 mcg total) vaginally 2 (two) times a week. 24 tablet 12  . fluorometholone (FML) 0.1 % ophthalmic suspension     . hydrochlorothiazide (HYDRODIURIL) 12.5 MG tablet Take 12.5 mg by mouth daily.    Marland Kitchen ibuprofen (ADVIL,MOTRIN) 200 MG tablet Take 200 mg by mouth as needed.    . RESTASIS MULTIDOSE 0.05 % ophthalmic emulsion      No current facility-administered medications for this visit.     PHYSICAL EXAMINATION: ECOG PERFORMANCE STATUS: 0 - Asymptomatic  Vitals:   01/23/17 1234  BP: 139/62  Pulse: 90  Resp: 20  Temp: 98 F (36.7 C)   Filed Weights   01/23/17 1234  Weight: 139 lb 9.6 oz (63.3 kg)    GENERAL:alert, no distress and comfortable SKIN: skin color, texture, turgor are normal, no rashes or significant lesions EYES: normal, Conjunctiva are pink and non-injected, sclera clear OROPHARYNX:no exudate, no erythema and lips, buccal mucosa, and tongue normal  NECK: supple, thyroid normal size, non-tender, without nodularity LYMPH:  no palpable lymphadenopathy in the cervical, axillary or inguinal LUNGS: clear to auscultation and percussion with normal breathing effort HEART: regular rate & rhythm and no murmurs and no lower extremity edema ABDOMEN:abdomen soft, non-tender and normal bowel sounds Musculoskeletal:no cyanosis of digits and no clubbing  NEURO: alert & oriented x 3 with fluent speech, no focal motor/sensory deficits  LABORATORY DATA:  I have reviewed the data as listed    Component Value Date/Time   NA 139 08/25/2014 1309   K 3.8 08/25/2014 1309   CL 102 08/29/2012 0934   CO2 23 08/25/2014 1309   GLUCOSE 99 08/25/2014 1309   GLUCOSE 100 (H) 08/29/2012 0934   BUN 13.8 08/25/2014 1309   CREATININE 0.6 08/25/2014 1309  CALCIUM 9.8 08/25/2014 1309   PROT 6.8 08/25/2014 1309   ALBUMIN 4.4 08/25/2014 1309   AST 18 08/25/2014 1309   ALT 21 08/25/2014 1309   ALKPHOS 111 08/25/2014 1309   BILITOT 0.54 08/25/2014 1309    No results found for: SPEP,  UPEP  Lab Results  Component Value Date   WBC 7.8 01/23/2017   NEUTROABS 2.1 01/23/2017   HGB 12.3 01/23/2017   HCT 36.7 01/23/2017   MCV 83.2 01/23/2017   PLT 236 01/23/2017      Chemistry      Component Value Date/Time   NA 139 08/25/2014 1309   K 3.8 08/25/2014 1309   CL 102 08/29/2012 0934   CO2 23 08/25/2014 1309   BUN 13.8 08/25/2014 1309   CREATININE 0.6 08/25/2014 1309      Component Value Date/Time   CALCIUM 9.8 08/25/2014 1309   ALKPHOS 111 08/25/2014 1309   AST 18 08/25/2014 1309   ALT 21 08/25/2014 1309   BILITOT 0.54 08/25/2014 1309       RADIOGRAPHIC STUDIES: I have personally reviewed the radiological images as listed and agreed with the findings in the report. US Abdomen Complete  Result Date: 01/04/2017 CLINICAL DATA:  Elevated liver function studies. EXAM: ABDOMEN ULTRASOUND COMPLETE COMPARISON:  No recent studies in PACs FINDINGS: Gallbladder: No gallstones or wall thickening visualized. No sonographic Murphy sign noted by sonographer. Common bile duct: Diameter: 3.6 mm Liver: The hepatic echotexture is increased diffusely. There is no focal mass nor ductal dilation. The surface contour of the liver appears smooth. IVC: No abnormality visualized. Pancreas: Visualization of the pancreatic tail was limited. The pancreatic head and body appear normal. Spleen: Size and appearance within normal limits. Right Kidney: Length: 11.1 cm. Echogenicity within normal limits. No mass or hydronephrosis visualized. Left Kidney: Length: 12.0 cm. Echogenicity within normal limits. No mass or hydronephrosis visualized. Abdominal aorta: No aneurysm visualized. Other findings: There is no ascites. IMPRESSION: Increased hepatic echotexture likely reflects fatty infiltrative change. There is no focal mass or ductal dilation. No acute abnormality is observed elsewhere within the abdomen. Visualization of the pancreatic tail was limited. Electronically Signed   By: David  [place] M.D.    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 given no evidence of disease progression after so many years I will continue to see her once a year with repeat history, physical examination and blood work Examination today showed no evidence of disease progression since last visit.  In fact, the absolute lymphocytosis has improved  Fatty infiltration of liver She had recent abnormal liver enzymes Ultrasound showed fatty liver disease I recommend exercise as tolerated, weight loss and dietary modification   No orders of the defined types were placed in this encounter.  All questions were answered. The patient knows to call the clinic with any problems, questions or concerns. No barriers to learning was detected. I spent 10 minutes counseling the patient face to face. The total time spent in the appointment was 15 minutes and more than 50% was on counseling and review of test results     Heath Lark, MD 01/23/2017 2:05 PM

## 2017-01-24 ENCOUNTER — Other Ambulatory Visit: Payer: Self-pay | Admitting: Certified Nurse Midwife

## 2017-01-24 NOTE — Telephone Encounter (Signed)
Medication refill request: vagifem  Last AEX:  12/01/16 DL Next AEX: 12/08/17 DL Last MMG (if hormonal medication request): 09/28/16 BIRADS1:Neg  Refill authorized: 12/01/16 #[id number]/12 R to [place].

## 2017-01-25 LAB — FLOW CYTOMETRY

## 2017-02-09 ENCOUNTER — Other Ambulatory Visit: Payer: Self-pay | Admitting: Hematology and Oncology

## 2017-02-09 DIAGNOSIS — C9110 Chronic lymphocytic leukemia of B-cell type not having achieved remission: Secondary | ICD-10-CM

## 2017-02-13 ENCOUNTER — Other Ambulatory Visit: Payer: Self-pay | Admitting: *Deleted

## 2017-02-13 DIAGNOSIS — C9110 Chronic lymphocytic leukemia of B-cell type not having achieved remission: Secondary | ICD-10-CM

## 2017-02-16 LAB — FISH,CLL PROGNOSTIC PANEL

## 2017-03-06 ENCOUNTER — Telehealth: Payer: Self-pay | Admitting: Certified Nurse Midwife

## 2017-03-06 NOTE — Telephone Encounter (Signed)
Called patient at #[phone] to discuss new Rx for Vagifem--Brand name only. Patient states the generic form did not lubricant as well as the name brand. Her insurance company has approved and states [facility] and write NAME BRAND only. She doesn't need until next week. Routing to D.Hollice Espy, CNM

## 2017-03-06 NOTE — Telephone Encounter (Signed)
Patient is requesting a refill of Vagifem. Patient is requesting name brand only due to side effects. Patient states that she has a letter from her insurance company agreeing to pay for the brand name.

## 2017-03-07 ENCOUNTER — Other Ambulatory Visit: Payer: Self-pay | Admitting: Certified Nurse Midwife

## 2017-03-07 DIAGNOSIS — R7989 Other specified abnormal findings of blood chemistry: Secondary | ICD-10-CM | POA: Diagnosis not present

## 2017-03-07 DIAGNOSIS — N952 Postmenopausal atrophic vaginitis: Secondary | ICD-10-CM

## 2017-03-07 MED ORDER — ESTRADIOL 10 MCG VA TABS: ORAL_TABLET | VAGINAL | 12 refills | Status: DC

## 2017-03-07 NOTE — Telephone Encounter (Signed)
Please let patient know this has been done.

## 2017-03-09 NOTE — Telephone Encounter (Signed)
Patient notified

## 2017-03-10 DIAGNOSIS — Z86010 Personal history of colonic polyps: Secondary | ICD-10-CM | POA: Insufficient documentation

## 2017-03-10 DIAGNOSIS — K5790 Diverticulosis of intestine, part unspecified, without perforation or abscess without bleeding: Secondary | ICD-10-CM | POA: Insufficient documentation

## 2017-03-10 DIAGNOSIS — M069 Rheumatoid arthritis, unspecified: Secondary | ICD-10-CM | POA: Insufficient documentation

## 2017-03-10 DIAGNOSIS — Z800 Family history of malignant neoplasm of digestive organs: Secondary | ICD-10-CM | POA: Insufficient documentation

## 2017-03-13 DIAGNOSIS — R7989 Other specified abnormal findings of blood chemistry: Secondary | ICD-10-CM | POA: Diagnosis not present

## 2017-04-11 DIAGNOSIS — J329 Chronic sinusitis, unspecified: Secondary | ICD-10-CM | POA: Diagnosis not present

## 2017-05-04 DIAGNOSIS — R945 Abnormal results of liver function studies: Secondary | ICD-10-CM | POA: Diagnosis not present

## 2017-05-16 DIAGNOSIS — Z23 Encounter for immunization: Secondary | ICD-10-CM | POA: Diagnosis not present

## 2017-05-29 DIAGNOSIS — J301 Allergic rhinitis due to pollen: Secondary | ICD-10-CM | POA: Diagnosis not present

## 2017-05-29 DIAGNOSIS — H6521 Chronic serous otitis media, right ear: Secondary | ICD-10-CM | POA: Diagnosis not present

## 2017-05-29 DIAGNOSIS — J321 Chronic frontal sinusitis: Secondary | ICD-10-CM | POA: Diagnosis not present

## 2017-05-29 DIAGNOSIS — J322 Chronic ethmoidal sinusitis: Secondary | ICD-10-CM | POA: Diagnosis not present

## 2017-05-29 DIAGNOSIS — J320 Chronic maxillary sinusitis: Secondary | ICD-10-CM | POA: Diagnosis not present

## 2017-05-29 DIAGNOSIS — B370 Candidal stomatitis: Secondary | ICD-10-CM | POA: Diagnosis not present

## 2017-06-12 DIAGNOSIS — J320 Chronic maxillary sinusitis: Secondary | ICD-10-CM | POA: Diagnosis not present

## 2017-06-12 DIAGNOSIS — J301 Allergic rhinitis due to pollen: Secondary | ICD-10-CM | POA: Diagnosis not present

## 2017-06-12 DIAGNOSIS — J322 Chronic ethmoidal sinusitis: Secondary | ICD-10-CM | POA: Diagnosis not present

## 2017-06-29 DIAGNOSIS — H04123 Dry eye syndrome of bilateral lacrimal glands: Secondary | ICD-10-CM | POA: Diagnosis not present

## 2017-06-29 DIAGNOSIS — H25013 Cortical age-related cataract, bilateral: Secondary | ICD-10-CM | POA: Diagnosis not present

## 2017-06-29 DIAGNOSIS — H3552 Pigmentary retinal dystrophy: Secondary | ICD-10-CM | POA: Diagnosis not present

## 2017-06-29 DIAGNOSIS — M3501 Sicca syndrome with keratoconjunctivitis: Secondary | ICD-10-CM | POA: Diagnosis not present

## 2017-06-29 DIAGNOSIS — J019 Acute sinusitis, unspecified: Secondary | ICD-10-CM | POA: Diagnosis not present

## 2017-06-29 DIAGNOSIS — H2513 Age-related nuclear cataract, bilateral: Secondary | ICD-10-CM | POA: Diagnosis not present

## 2017-08-15 DIAGNOSIS — R197 Diarrhea, unspecified: Secondary | ICD-10-CM | POA: Diagnosis not present

## 2017-08-15 DIAGNOSIS — C9110 Chronic lymphocytic leukemia of B-cell type not having achieved remission: Secondary | ICD-10-CM | POA: Diagnosis not present

## 2017-08-15 DIAGNOSIS — M0609 Rheumatoid arthritis without rheumatoid factor, multiple sites: Secondary | ICD-10-CM | POA: Diagnosis not present

## 2017-08-16 ENCOUNTER — Other Ambulatory Visit: Payer: Self-pay | Admitting: Cardiology

## 2017-08-16 ENCOUNTER — Other Ambulatory Visit: Payer: Self-pay | Admitting: Certified Nurse Midwife

## 2017-08-16 DIAGNOSIS — Z1231 Encounter for screening mammogram for malignant neoplasm of breast: Secondary | ICD-10-CM

## 2017-08-16 DIAGNOSIS — R197 Diarrhea, unspecified: Secondary | ICD-10-CM | POA: Diagnosis not present

## 2017-08-18 ENCOUNTER — Telehealth: Payer: Self-pay | Admitting: Certified Nurse Midwife

## 2017-08-18 DIAGNOSIS — A0472 Enterocolitis due to Clostridium difficile, not specified as recurrent: Secondary | ICD-10-CM | POA: Diagnosis not present

## 2017-08-18 DIAGNOSIS — R7989 Other specified abnormal findings of blood chemistry: Secondary | ICD-10-CM | POA: Diagnosis not present

## 2017-08-18 NOTE — Telephone Encounter (Signed)
[facility] calling for prior authorization on vagifem. Phone [phone] and reference number is [id number].

## 2017-08-21 NOTE — Telephone Encounter (Signed)
PA completed from [employer] and placed on [name], CNM desk for review and signature.

## 2017-08-22 NOTE — Telephone Encounter (Signed)
Fax from [employer] received. PA for Vagifem 10 mcg vaginal tablet approved 08/22/17 -07/31/18.   Spoke with patient, advised as seen above. Patient verbalizes understanding and is agreeable. Patient request to provide [name], CNM with update, reports she has c-diff.   Advised patient will update [name], CNM, I will return call with any additional recommendations. Patient is agreeable.   Routing to provider for final review. Patient is agreeable to disposition. Will close encounter.

## 2017-08-22 NOTE — Telephone Encounter (Signed)
PA faxed to [facility].

## 2017-08-22 NOTE — Telephone Encounter (Signed)
PA faxed to [facility] at [phone].

## 2017-08-22 NOTE — Telephone Encounter (Signed)
Audrey Sweeney from [facility] calling regarding prior authorization that was faxed over. [place] TLX#[id number]

## 2017-08-22 NOTE — Telephone Encounter (Signed)
Form received from [name] for additional information for PA.   Call returned to patient. Patient states she used Audrey Sweeney vaginal tablet for 2 months end of 2017. Has used vaginal cream in the past many years ago, unsure if name, ineffective for vaginal dryness.   PA completed and to [name], CNM for signature.

## 2017-08-22 NOTE — Telephone Encounter (Signed)
Call returned to [facility], spoke with [place]. Was advised "unsure what return call was about, PA still in progress, this department does not use reference numbers".

## 2017-09-04 DIAGNOSIS — R7989 Other specified abnormal findings of blood chemistry: Secondary | ICD-10-CM | POA: Diagnosis not present

## 2017-09-04 DIAGNOSIS — A0472 Enterocolitis due to Clostridium difficile, not specified as recurrent: Secondary | ICD-10-CM | POA: Diagnosis not present

## 2017-09-04 DIAGNOSIS — E559 Vitamin D deficiency, unspecified: Secondary | ICD-10-CM | POA: Diagnosis not present

## 2017-09-05 DIAGNOSIS — R197 Diarrhea, unspecified: Secondary | ICD-10-CM | POA: Diagnosis not present

## 2017-09-27 DIAGNOSIS — R197 Diarrhea, unspecified: Secondary | ICD-10-CM | POA: Diagnosis not present

## 2017-09-28 ENCOUNTER — Ambulatory Visit
Admission: RE | Admit: 2017-09-28 | Discharge: 2017-09-28 | Disposition: A | Discharge status: Home / Self Care | Payer: PPO | Source: Ambulatory Visit | Attending: Certified Nurse Midwife | Admitting: Certified Nurse Midwife

## 2017-09-28 DIAGNOSIS — Z1231 Encounter for screening mammogram for malignant neoplasm of breast: Secondary | ICD-10-CM

## 2017-10-03 DIAGNOSIS — H3552 Pigmentary retinal dystrophy: Secondary | ICD-10-CM | POA: Diagnosis not present

## 2017-10-03 DIAGNOSIS — H04123 Dry eye syndrome of bilateral lacrimal glands: Secondary | ICD-10-CM | POA: Diagnosis not present

## 2017-10-03 DIAGNOSIS — M3501 Sicca syndrome with keratoconjunctivitis: Secondary | ICD-10-CM | POA: Diagnosis not present

## 2017-10-03 DIAGNOSIS — H2511 Age-related nuclear cataract, right eye: Secondary | ICD-10-CM | POA: Diagnosis not present

## 2017-10-03 DIAGNOSIS — H25013 Cortical age-related cataract, bilateral: Secondary | ICD-10-CM | POA: Diagnosis not present

## 2017-10-03 DIAGNOSIS — H2513 Age-related nuclear cataract, bilateral: Secondary | ICD-10-CM | POA: Diagnosis not present

## 2017-11-06 DIAGNOSIS — J302 Other seasonal allergic rhinitis: Secondary | ICD-10-CM | POA: Diagnosis not present

## 2017-11-06 DIAGNOSIS — A0472 Enterocolitis due to Clostridium difficile, not specified as recurrent: Secondary | ICD-10-CM | POA: Diagnosis not present

## 2017-12-08 ENCOUNTER — Encounter: Payer: Self-pay | Admitting: Certified Nurse Midwife

## 2017-12-08 ENCOUNTER — Other Ambulatory Visit: Payer: Self-pay

## 2017-12-08 ENCOUNTER — Ambulatory Visit (INDEPENDENT_AMBULATORY_CARE_PROVIDER_SITE_OTHER): Payer: PPO | Admitting: Certified Nurse Midwife

## 2017-12-08 VITALS — BP 120/60 | HR 68 | Resp 16 | Ht 65.25 in | Wt 138.0 lb

## 2017-12-08 DIAGNOSIS — N952 Postmenopausal atrophic vaginitis: Secondary | ICD-10-CM

## 2017-12-08 DIAGNOSIS — Z01419 Encounter for gynecological examination (general) (routine) without abnormal findings: Secondary | ICD-10-CM | POA: Diagnosis not present

## 2017-12-08 MED ORDER — ESTRADIOL 10 MCG VA TABS
ORAL_TABLET | VAGINAL | 11 refills | Status: DC
Start: 2017-12-08 — End: 2018-12-28

## 2017-12-08 NOTE — Progress Notes (Signed)
72 y.o. G3P3 Married  Caucasian Fe here for annual exam. Menopausal no HRT. Denies vaginal bleeding. Currently being treated for C-Diff after being treated for sinusitis with Clindamycin. Still being treated  with Vancomycin and will be on this for another month. Feeling better, has had 10 pound weight loss, still fatigues easily, but working. No other health issues today. Continues to use Vagifem for vaginal dryness, with good results. Sees PCP for aex, labs and prn.   Patient's last menstrual period was 08/01/2006.          Sexually active: Yes.    The current method of family planning is tubal ligation.    Exercising: Yes.    walking & gardening Smoker:  no  Health Maintenance: Pap:  12-08-15 neg no endos-atrophy, 12-01-16 neg History of Abnormal Pap: maybe? MMG:  09-28-17 category b density birads 1:neg Self Breast exams: occ Colonoscopy:  2017 neg f/u 63yrs BMD:   2015 ?  TDaP:  2018 Shingles: not done Pneumonia: 2016 Hep C and HIV: hep c neg per patient Labs: with PCP   reports that she has never smoked. She has never used smokeless tobacco. She reports that she does not drink alcohol or use drugs.  Past Medical History:  Diagnosis Date  . Arthritis   . CLL (chronic lymphocytic leukemia) (HCC)    Rai stage 0.  . Depression   . Hypertension   . IBS (irritable bowel syndrome)   . Osteopenia   . Rheumatoid arthritis(714.0)     Past Surgical History:  Procedure Laterality Date  . CESAREAN SECTION    . COLONOSCOPY W/ POLYPECTOMY     Pre-cancerous polyps removed.  . ENDOMETRIAL BIOPSY     8/98  . TUBAL LIGATION      Current Outpatient Medications  Medication Sig Dispense Refill  . cetirizine (ZYRTEC) 10 MG tablet Take 10 mg by mouth as needed.    . Estradiol (VAGIFEM) 10 MCG TABS vaginal tablet One vaginally twice weekly 18 tablet 12  . fluorometholone (FML) 0.1 % ophthalmic suspension     . hydrochlorothiazide (HYDRODIURIL) 12.5 MG tablet Take 12.5 mg by mouth daily.     Marland Kitchen ibuprofen (ADVIL,MOTRIN) 200 MG tablet Take 200 mg by mouth as needed.    . RESTASIS MULTIDOSE 0.05 % ophthalmic emulsion      No current facility-administered medications for this visit.     Family History  Problem Relation Age of Onset  . Colon cancer Mother   . Stroke Father   . Diabetes Father   . Heart disease Father   . Hypertension Father   . Heart disease Maternal Grandmother   . Diabetes Maternal Grandmother   . Cancer Paternal Grandmother        female cancer  . Emphysema Maternal Grandfather   . Breast cancer Cousin        passed away age 30    ROS:  Pertinent items are noted in HPI.  Otherwise, a comprehensive ROS was negative.  Exam:   LMP 08/01/2006    Ht Readings from Last 3 Encounters:  01/23/17 5' 2.25" (1.581 m)  12/01/16 5' 5.25" (1.657 m)  12/01/15 5' 5.25" (1.657 m)    General appearance: alert, cooperative and appears stated age Head: Normocephalic, without obvious abnormality, atraumatic Neck: no adenopathy, supple, symmetrical, trachea midline and thyroid normal to inspection and palpation Lungs: clear to auscultation bilaterally Breasts: normal appearance, no masses or tenderness, No nipple retraction or dimpling, No nipple discharge or bleeding, No axillary  or supraclavicular adenopathy Heart: regular rate and rhythm Abdomen: soft, non-tender; no masses,  no organomegaly Extremities: extremities normal, atraumatic, no cyanosis or edema Skin: Skin color, texture, turgor normal. No rashes or lesions Lymph nodes: Cervical, supraclavicular, and axillary nodes normal. No abnormal inguinal nodes palpated Neurologic: Grossly normal   Pelvic: External genitalia:  no lesions              Urethra:  normal appearing urethra with no masses, tenderness or lesions              Bartholin's and Skene's: normal                 Vagina: normal appearing vagina with normal color and discharge, no lesions              Cervix: multiparous appearance, no  cervical motion tenderness and no lesions              Pap taken: No. Bimanual Exam:  Uterus:  normal size, contour, position, consistency, mobility, non-tender and anteverted              Adnexa: normal adnexa and no mass, fullness, tenderness               Rectovaginal: Confirms               Anus:  normal sphincter tone, no lesions  Chaperone present: yes  A:  Well Woman with normal exam  Menopausal no HRT  Atrophic vaginitis desires vaginal estrogen again, but generic if possible  Recovering from C-Diff under GI follow up  Osteopenia with PCP management  P:   Reviewed health and wellness pertinent to exam  Discussed importance of calling if vaginal bleeding  Discussed risks and benefits of vaginal estrogen . Desires continuance  Rx Yuvafem see order with instructions  Continue with follow up with C-Diff with MD   Pap smear: no  counseled on breast self exam, mammography screening, feminine hygiene, adequate intake of calcium and vitamin D, diet and exercise  return annually or prn  An After Visit Summary was printed and given to the patient.

## 2017-12-08 NOTE — Patient Instructions (Signed)

## 2017-12-19 ENCOUNTER — Ambulatory Visit: Payer: PPO | Admitting: Podiatry

## 2017-12-22 DIAGNOSIS — I10 Essential (primary) hypertension: Secondary | ICD-10-CM | POA: Diagnosis not present

## 2017-12-22 DIAGNOSIS — E559 Vitamin D deficiency, unspecified: Secondary | ICD-10-CM | POA: Diagnosis not present

## 2017-12-22 DIAGNOSIS — E7800 Pure hypercholesterolemia, unspecified: Secondary | ICD-10-CM | POA: Diagnosis not present

## 2017-12-22 DIAGNOSIS — J302 Other seasonal allergic rhinitis: Secondary | ICD-10-CM | POA: Diagnosis not present

## 2017-12-22 DIAGNOSIS — R0989 Other specified symptoms and signs involving the circulatory and respiratory systems: Secondary | ICD-10-CM | POA: Diagnosis not present

## 2017-12-22 DIAGNOSIS — Z0000 Encounter for general adult medical examination without abnormal findings: Secondary | ICD-10-CM | POA: Diagnosis not present

## 2017-12-22 DIAGNOSIS — M0609 Rheumatoid arthritis without rheumatoid factor, multiple sites: Secondary | ICD-10-CM | POA: Diagnosis not present

## 2017-12-26 ENCOUNTER — Other Ambulatory Visit: Payer: Self-pay | Admitting: Podiatry

## 2017-12-26 ENCOUNTER — Ambulatory Visit (INDEPENDENT_AMBULATORY_CARE_PROVIDER_SITE_OTHER): Payer: PPO | Admitting: Podiatry

## 2017-12-26 ENCOUNTER — Ambulatory Visit (INDEPENDENT_AMBULATORY_CARE_PROVIDER_SITE_OTHER): Payer: PPO

## 2017-12-26 ENCOUNTER — Other Ambulatory Visit: Payer: Self-pay | Admitting: Family Medicine

## 2017-12-26 ENCOUNTER — Encounter: Payer: Self-pay | Admitting: Podiatry

## 2017-12-26 VITALS — BP 122/64 | HR 85 | Resp 16

## 2017-12-26 DIAGNOSIS — M778 Other enthesopathies, not elsewhere classified: Secondary | ICD-10-CM

## 2017-12-26 DIAGNOSIS — M779 Enthesopathy, unspecified: Secondary | ICD-10-CM | POA: Diagnosis not present

## 2017-12-26 DIAGNOSIS — R0989 Other specified symptoms and signs involving the circulatory and respiratory systems: Secondary | ICD-10-CM

## 2017-12-26 DIAGNOSIS — M79672 Pain in left foot: Secondary | ICD-10-CM

## 2017-12-26 NOTE — Progress Notes (Signed)
Subjective:  Patient ID: Audrey Sweeney, female    DOB: 02-06-1946,  MRN: [id number] HPI Chief Complaint  Patient presents with  . Foot Pain    Dorsal midfoot left - aching, pain radiates into leg/ankle, swelling, notices 2nd and 3rd toes separate when walking, had orthotics in past but pushed foot into shoe more, takes Tylenol PRN  . New Patient (Initial Visit)    72 y.o. female presents with the above complaint.   ROS: Denies fever chills nausea vomiting muscle aches pains calf pain back pain chest pain shortness of breath and headache.  Past Medical History:  Diagnosis Date  . Abnormal Pap smear of cervix    ?  Marland Kitchen Arthritis   . C. difficile colitis   . CLL (chronic lymphocytic leukemia) (HCC)    Rai stage 0.  . Depression   . Hypertension   . IBS (irritable bowel syndrome)   . Osteopenia   . Rheumatoid arthritis(714.0)    Past Surgical History:  Procedure Laterality Date  . CESAREAN SECTION    . COLONOSCOPY W/ POLYPECTOMY     Pre-cancerous polyps removed.  . ENDOMETRIAL BIOPSY     8/98  . TUBAL LIGATION      Current Outpatient Medications:  .  Acetaminophen (TYLENOL PO), Take by mouth., Disp: , Rfl:  .  cetirizine (ZYRTEC) 10 MG tablet, Take 10 mg by mouth as needed., Disp: , Rfl:  .  Estradiol (YUVAFEM) 10 MCG TABS vaginal tablet, Insert one vaginally twice weekly, Disp: 8 tablet, Rfl: 11 .  hydrochlorothiazide (HYDRODIURIL) 12.5 MG tablet, Take 12.5 mg by mouth daily., Disp: , Rfl:  .  Polyethyl Glycol-Propyl Glycol (SYSTANE OP), Apply to eye., Disp: , Rfl:  .  vancomycin (VANCOCIN) 125 MG capsule, Take 125 mg by mouth 4 (four) times daily., Disp: , Rfl:   Allergies  Allergen Reactions  . Contrast Media [Iodinated Diagnostic Agents] Shortness Of Breath    IV Contrast allergy.   Pt has SOB even with premedication.  . Provera [Medroxyprogesterone Acetate]     Headache & depression  . Sulfa Antibiotics     rash   Review of Systems Objective:   Vitals:   12/26/17 1441  BP: 122/64  Pulse: 85  Resp: 16    General: Well developed, nourished, in no acute distress, alert and oriented x3   Dermatological: Skin is warm, dry and supple bilateral. Nails x 10 are well maintained; remaining integument appears unremarkable at this time. There are no open sores, no preulcerative lesions, no rash or signs of infection present.  Vascular: Dorsalis Pedis artery and Posterior Tibial artery pedal pulses are 2/4 bilateral with immedate capillary fill time. Pedal hair growth present. No varicosities and no lower extremity edema present bilateral.   Neruologic: Grossly intact via light touch bilateral. Vibratory intact via tuning fork bilateral. Protective threshold with Semmes Wienstein monofilament intact to all pedal sites bilateral. Patellar and Achilles deep tendon reflexes 2+ bilateral. No Babinski or clonus noted bilateral.   Musculoskeletal: No gross boney pedal deformities bilateral. No pain, crepitus, or limitation noted with foot and ankle range of motion bilateral. Muscular strength 5/5 in all groups tested bilateral.  Hammertoe deformities rigid in nature with separation of the second third toes of the left foot.  When trying to reduce these there is a considerable amount of discomfort.  She also has tenderness on palpation between the metatarsal phalangeal joints with soft tissue swelling plantarly.  Gait: Unassisted, Nonantalgic.    Radiographs:  Radiographs  taken today demonstrate no acute findings.  Chronic findings consist of medial deviation of the second toe at the level of the metatarsal phalangeal joint left foot.  Lateral deviation of the third simulating.  She also has significant osteoarthritis of the midfoot talonavicular joint navicular cuneiform joint.  Large dorsal spurring is noted.  Assessment & Plan:   Assessment: Osteoarthritis dorsal aspect left foot.  Hammertoe deformity with pre-dislocation syndrome and capsulitis of the  second and third metatarsal phalangeal joint left foot.  Plan: Discussed appropriate shoe gear stretching exercise ice therapy sugar modifications.  Injected 20 mg Kenalog 5 mg Marcaine dorsal aspect of the left foot proximal to the dorsal exostosis.  Also injected 10 mg of triamcinolone and 5 mg of Marcaine around the second metatarsal phalangeal joint.  Discussed appropriate shoe gear stretching exercises and ice therapy.  Follow-up with her in 1 month.  Will probably need an MRI of the forefoot.     Apple Dearmas T. [place], [place]

## 2018-01-02 ENCOUNTER — Ambulatory Visit
Admission: RE | Admit: 2018-01-02 | Discharge: 2018-01-02 | Disposition: A | Discharge status: Home / Self Care | Payer: PPO | Source: Ambulatory Visit | Attending: Family Medicine | Admitting: Family Medicine

## 2018-01-02 DIAGNOSIS — I6523 Occlusion and stenosis of bilateral carotid arteries: Secondary | ICD-10-CM | POA: Diagnosis not present

## 2018-01-02 DIAGNOSIS — R0989 Other specified symptoms and signs involving the circulatory and respiratory systems: Secondary | ICD-10-CM

## 2018-01-03 DIAGNOSIS — H2511 Age-related nuclear cataract, right eye: Secondary | ICD-10-CM | POA: Diagnosis not present

## 2018-01-03 DIAGNOSIS — H25811 Combined forms of age-related cataract, right eye: Secondary | ICD-10-CM | POA: Diagnosis not present

## 2018-01-09 DIAGNOSIS — H25012 Cortical age-related cataract, left eye: Secondary | ICD-10-CM | POA: Diagnosis not present

## 2018-01-09 DIAGNOSIS — H2512 Age-related nuclear cataract, left eye: Secondary | ICD-10-CM | POA: Diagnosis not present

## 2018-01-16 ENCOUNTER — Ambulatory Visit: Payer: PPO | Admitting: Podiatry

## 2018-01-17 DIAGNOSIS — H2512 Age-related nuclear cataract, left eye: Secondary | ICD-10-CM | POA: Diagnosis not present

## 2018-01-17 DIAGNOSIS — H25812 Combined forms of age-related cataract, left eye: Secondary | ICD-10-CM | POA: Diagnosis not present

## 2018-01-23 ENCOUNTER — Encounter: Payer: Self-pay | Admitting: Hematology and Oncology

## 2018-01-23 ENCOUNTER — Telehealth: Payer: Self-pay

## 2018-01-23 ENCOUNTER — Inpatient Hospital Stay: Payer: PPO

## 2018-01-23 ENCOUNTER — Other Ambulatory Visit: Payer: Self-pay | Admitting: Hematology and Oncology

## 2018-01-23 ENCOUNTER — Telehealth: Payer: Self-pay | Admitting: Hematology and Oncology

## 2018-01-23 ENCOUNTER — Inpatient Hospital Stay: Payer: PPO | Attending: Hematology and Oncology | Admitting: Hematology and Oncology

## 2018-01-23 VITALS — BP 127/57 | HR 74 | Temp 97.8°F | Resp 18 | Ht 65.25 in | Wt 137.6 lb

## 2018-01-23 DIAGNOSIS — J329 Chronic sinusitis, unspecified: Secondary | ICD-10-CM | POA: Insufficient documentation

## 2018-01-23 DIAGNOSIS — D801 Nonfamilial hypogammaglobulinemia: Secondary | ICD-10-CM | POA: Diagnosis not present

## 2018-01-23 DIAGNOSIS — C9110 Chronic lymphocytic leukemia of B-cell type not having achieved remission: Secondary | ICD-10-CM

## 2018-01-23 DIAGNOSIS — J328 Other chronic sinusitis: Secondary | ICD-10-CM

## 2018-01-23 LAB — CBC WITH DIFFERENTIAL/PLATELET
Basophils Absolute: 0 10*3/uL (ref 0.0–0.1)
Basophils Relative: 1 %
Eosinophils Absolute: 0.2 10*3/uL (ref 0.0–0.5)
Eosinophils Relative: 2 %
HCT: 36.2 % (ref 34.8–46.6)
Hemoglobin: 12.4 g/dL (ref 11.6–15.9)
Lymphocytes Relative: 49 %
Lymphs Abs: 3.8 10*3/uL — ABNORMAL HIGH (ref 0.9–3.3)
MCH: 28.9 pg (ref 25.1–34.0)
MCHC: 34.2 g/dL (ref 31.5–36.0)
MCV: 84.3 fL (ref 79.5–101.0)
Monocytes Absolute: 0.4 10*3/uL (ref 0.1–0.9)
Monocytes Relative: 6 %
Neutro Abs: 3.2 10*3/uL (ref 1.5–6.5)
Neutrophils Relative %: 42 %
Platelets: 216 10*3/uL (ref 145–400)
RBC: 4.29 MIL/uL (ref 3.70–5.45)
RDW: 13.9 % (ref 11.2–14.5)
WBC: 7.6 10*3/uL (ref 3.9–10.3)

## 2018-01-23 NOTE — Telephone Encounter (Signed)
Gave avs and calendar ° °

## 2018-01-23 NOTE — Telephone Encounter (Signed)
Called Dr. Deeann Saint office regarding referral. Faxed referral to [phone]. They will call patient with appt.

## 2018-01-23 NOTE — Progress Notes (Signed)
[facility] OFFICE PROGRESS NOTE  Patient Care Team: Leighton Ruff, MD as PCP - General (Family Medicine) Heath Lark, MD as Consulting Physician (Hematology and Oncology)  ASSESSMENT & PLAN:  CLL (chronic lymphocytic leukemia) ([place]) From the CLL standpoint, she has no signs of cancer progression In fact, her lymphocytosis is improved likely due to recent steroid exposure However, she is developing recurrent infection which I suspect is due to acquired hypogammaglobulinemia associated with CLL I have also reviewed her vaccination profile and she will inquire from her primary doctor regarding pneumococcal vaccination Plan to see her back in 3 months for further follow-up  Chronic sinusitis, unspecified She have recurrent sinusitis last year culminating to multiple courses of antibiotics and severe, recurrent C. difficile infection I recommend ENT consultation for thorough evaluation of the anatomy of her sinuses to see if any kind of surgical intervention is needed  Acquired hypogammaglobulinemia ([place]) Her recent recurrent sinus infection could be associated with acquired hypogammaglobulinemia I plan to recheck immunoglobulin levels in her next visit and if she starts to have recurrent upper respiratory tract infections again, we can consider monthly IVIG treatment   Orders Placed This Encounter  Procedures  . Comprehensive metabolic panel    Standing Status:   Future    Standing Expiration Date:   02/27/2019  . CBC with Differential/Platelet    Standing Status:   Future    Standing Expiration Date:   02/27/2019  . IgG, IgA, IgM    Standing Status:   Future    Standing Expiration Date:   02/27/2019  . Ambulatory referral to ENT    Referral Priority:   Routine    Referral Type:   Consultation    Referral Reason:   Specialty Services Required    Referred to Provider:   Leta Baptist, MD    Requested Specialty:   Otolaryngology    Number of Visits Requested:   1     INTERVAL HISTORY: Please see below for problem oriented charting. She returns for further follow-up She had severe recurrent sinus infection last year, treated with multiple courses of antibiotic therapy leading to diagnosis of C. difficile She has recovered well since then She denies recurrence of sinus symptoms No new lymphadenopathy She had recent steroid injections to her feet and is using topical steroid eyedrops since recent cataract surgery  SUMMARY OF ONCOLOGIC HISTORY:   CLL (chronic lymphocytic leukemia) ([place])   02/24/2004 Pathology Results     Case #: [id number] confirmed CLL      01/23/2017 Pathology Results    Peripheral Blood Flow Cytometry - CD5-POSITIVE MONOCLONAL B CELL POPULATION MOST CONSISTENT WITH CHRONIC LYMPHOCYTIC LEUKEMIA - SEE COMMENT Diagnosis Comment: Examination of the peripheral blood reveals a lymphocytosis with frequent small lymphocytes that have clumped chromatin and scant cytoplasm . Larger cells with prominent nucleoli are not identified. By flow cytometry, a B-cell population that expresses CD5, CD19, CD20, CD21, CD22, HLA-DR and CD23 comprises 46% of all lymphocytes. Overall, the morphology and immunophenotype are consistent with the patient's history of chronic lymphocytic leukemia      01/23/2017 Pathology Results    FISh confirmed deletion 13 q       01/23/2018 Cancer Staging    Staging form: Chronic Lymphocytic Leukemia / Small Lymphocytic Lymphoma, AJCC 8th Edition - Clinical: Modified Rai Stage 0 (Modified Rai risk: Low, Lymphocytosis: Present, Adenopathy: Absent, Organomegaly: Absent, Anemia: Absent, Thrombocytopenia: Absent) - Signed by Heath Lark, MD on 01/23/2018       REVIEW  OF SYSTEMS:   Constitutional: Denies fevers, chills or abnormal weight loss Eyes: Denies blurriness of vision Ears, nose, mouth, throat, and face: Denies mucositis or sore throat Respiratory: Denies cough, dyspnea or wheezes Cardiovascular: Denies  palpitation, chest discomfort or lower extremity swelling Gastrointestinal:  Denies nausea, heartburn or change in bowel habits Skin: Denies abnormal skin rashes Lymphatics: Denies new lymphadenopathy or easy bruising Neurological:Denies numbness, tingling or new weaknesses Behavioral/Psych: Mood is stable, no new changes  All other systems were reviewed with the patient and are negative.  I have reviewed the past medical history, past surgical history, social history and family history with the patient and they are unchanged from previous note.  ALLERGIES:  is allergic to contrast media [iodinated diagnostic agents]; provera [medroxyprogesterone acetate]; and sulfa antibiotics.  MEDICATIONS:  Current Outpatient Medications  Medication Sig Dispense Refill  . nepafenac (ILEVRO) 0.3 % ophthalmic suspension Place 1 drop into both eyes daily.    . prednisoLONE acetate (PRED FORTE) 1 % ophthalmic suspension Place 1 drop into both eyes 4 (four) times daily.    . Acetaminophen (TYLENOL PO) Take by mouth.    . cetirizine (ZYRTEC) 10 MG tablet Take 10 mg by mouth as needed.    . Estradiol (YUVAFEM) 10 MCG TABS vaginal tablet Insert one vaginally twice weekly 8 tablet 11  . hydrochlorothiazide (HYDRODIURIL) 12.5 MG tablet Take 12.5 mg by mouth daily.    Vladimir Faster Glycol-Propyl Glycol (SYSTANE OP) Apply to eye.     No current facility-administered medications for this visit.     PHYSICAL EXAMINATION: ECOG PERFORMANCE STATUS: 1 - Symptomatic but completely ambulatory  Vitals:   01/23/18 0859  BP: (!) 127/57  Pulse: 74  Resp: 18  Temp: 97.8 F (36.6 C)  SpO2: 100%   Filed Weights   01/23/18 0859  Weight: 137 lb 9.6 oz (62.4 kg)    GENERAL:alert, no distress and comfortable SKIN: skin color, texture, turgor are normal, no rashes or significant lesions EYES: normal, Conjunctiva are pink and non-injected, sclera clear OROPHARYNX:no exudate, no erythema and lips, buccal mucosa, and  tongue normal  NECK: supple, thyroid normal size, non-tender, without nodularity LYMPH:  no palpable lymphadenopathy in the cervical, axillary or inguinal LUNGS: clear to auscultation and percussion with normal breathing effort HEART: regular rate & rhythm and no murmurs and no lower extremity edema ABDOMEN:abdomen soft, non-tender and normal bowel sounds Musculoskeletal:no cyanosis of digits and no clubbing  NEURO: alert & oriented x 3 with fluent speech, no focal motor/sensory deficits  LABORATORY DATA:  I have reviewed the data as listed    Component Value Date/Time   NA 139 08/25/2014 1309   K 3.8 08/25/2014 1309   CL 102 08/29/2012 0934   CO2 23 08/25/2014 1309   GLUCOSE 99 08/25/2014 1309   GLUCOSE 100 (H) 08/29/2012 0934   BUN 13.8 08/25/2014 1309   CREATININE 0.6 08/25/2014 1309   CALCIUM 9.8 08/25/2014 1309   PROT 6.8 08/25/2014 1309   ALBUMIN 4.4 08/25/2014 1309   AST 18 08/25/2014 1309   ALT 21 08/25/2014 1309   ALKPHOS 111 08/25/2014 1309   BILITOT 0.54 08/25/2014 1309    No results found for: SPEP, UPEP  Lab Results  Component Value Date   WBC 7.6 01/23/2018   NEUTROABS 3.2 01/23/2018   HGB 12.4 01/23/2018   HCT 36.2 01/23/2018   MCV 84.3 01/23/2018   PLT 216 01/23/2018      Chemistry      Component Value Date/Time  NA 139 08/25/2014 1309   K 3.8 08/25/2014 1309   CL 102 08/29/2012 0934   CO2 23 08/25/2014 1309   BUN 13.8 08/25/2014 1309   CREATININE 0.6 08/25/2014 1309      Component Value Date/Time   CALCIUM 9.8 08/25/2014 1309   ALKPHOS 111 08/25/2014 1309   AST 18 08/25/2014 1309   ALT 21 08/25/2014 1309   BILITOT 0.54 08/25/2014 1309       RADIOGRAPHIC STUDIES: I have personally reviewed the radiological images as listed and agreed with the findings in the report. US Carotid Bilateral  Result Date: 01/02/2018 CLINICAL DATA:  Asymptomatic carotid bruit EXAM: BILATERAL CAROTID DUPLEX ULTRASOUND TECHNIQUE: Pearline Cables scale imaging, color  Doppler and duplex ultrasound were performed of bilateral carotid and vertebral arteries in the neck. COMPARISON:  12/05/2014 FINDINGS: Criteria: Quantification of carotid stenosis is based on velocity parameters that correlate the residual internal carotid diameter with NASCET-based stenosis levels, using the diameter of the distal internal carotid lumen as the denominator for stenosis measurement. The following velocity measurements were obtained: RIGHT ICA: 82/19 cm/sec CCA: 858/85 cm/sec SYSTOLIC ICA/CCA RATIO:  0.7 ECA:  122 cm/sec LEFT ICA: 102/30 cm/sec CCA: 027/74 cm/sec SYSTOLIC ICA/CCA RATIO:  0.8 ECA:  114 cm/sec RIGHT CAROTID ARTERY: Minor carotid atherosclerosis and intimal thickening. No hemodynamically significant right ICA stenosis, velocity elevation, or turbulent flow. Degree of narrowing less than 50%. RIGHT VERTEBRAL ARTERY:  Antegrade LEFT CAROTID ARTERY: Similar scattered minor carotid atherosclerosis and intimal thickening. No hemodynamically significant left ICA stenosis, velocity elevation, or turbulent flow. LEFT VERTEBRAL ARTERY:  Antegrade IMPRESSION: Minor carotid atherosclerosis. No hemodynamically significant ICA stenosis. Degree of narrowing less than 50% bilaterally by ultrasound criteria. Patent antegrade vertebral flow bilaterally Electronically Signed   By: Jerilynn Mages.  Shick M.D.   On: 01/02/2018 15:17   Dg Foot Complete Left  Result Date: 12/26/2017 Please see detailed radiograph report in office note.   All questions were answered. The patient knows to call the clinic with any problems, questions or concerns. No barriers to learning was detected.  I spent 15 minutes counseling the patient face to face. The total time spent in the appointment was 20 minutes and more than 50% was on counseling and review of test results  Heath Lark, MD 01/23/2018 9:52 AM

## 2018-01-23 NOTE — Assessment & Plan Note (Signed)
Her recent recurrent sinus infection could be associated with acquired hypogammaglobulinemia I plan to recheck immunoglobulin levels in her next visit and if she starts to have recurrent upper respiratory tract infections again, we can consider monthly IVIG treatment

## 2018-01-23 NOTE — Assessment & Plan Note (Addendum)
From the CLL standpoint, she has no signs of cancer progression In fact, her lymphocytosis is improved likely due to recent steroid exposure However, she is developing recurrent infection which I suspect is due to acquired hypogammaglobulinemia associated with CLL I have also reviewed her vaccination profile and she will inquire from her primary doctor regarding pneumococcal vaccination Plan to see her back in 3 months for further follow-up

## 2018-01-23 NOTE — Assessment & Plan Note (Signed)
She have recurrent sinusitis last year culminating to multiple courses of antibiotics and severe, recurrent C. difficile infection I recommend ENT consultation for thorough evaluation of the anatomy of her sinuses to see if any kind of surgical intervention is needed

## 2018-03-06 ENCOUNTER — Other Ambulatory Visit: Payer: Self-pay | Admitting: *Deleted

## 2018-03-06 NOTE — Patient Outreach (Signed)
[facility]) Care Management  03/06/2018  EAVAN GONTERMAN 29-Dec-1945 [id number]  [facility] CM HTA HRA Screening call attempt #1  Unsuccessful telephone outreach to [name], 72 y/o female with history including, but not limited to, diverticulosis, chronic lymphocytic leukemia (CLL); rheumatoid arthritis, and sinusitis.   HIPAA compliant voice mail message left for patient, requesting return call back.  Plan:  Will re-attempt [place] telephone outreach for screening later this week if I do not hear back from patient first.  Oneta Rack, RN, BSN, [place] Coordinator [facility] Care Management  [phone]

## 2018-03-13 ENCOUNTER — Other Ambulatory Visit: Payer: Self-pay | Admitting: *Deleted

## 2018-03-13 DIAGNOSIS — J310 Chronic rhinitis: Secondary | ICD-10-CM | POA: Diagnosis not present

## 2018-03-13 DIAGNOSIS — J343 Hypertrophy of nasal turbinates: Secondary | ICD-10-CM | POA: Diagnosis not present

## 2018-03-13 DIAGNOSIS — J342 Deviated nasal septum: Secondary | ICD-10-CM | POA: Diagnosis not present

## 2018-03-13 NOTE — Patient Outreach (Signed)
[facility]) Care Management  03/13/2018  Audrey Sweeney July 31, 1946 [id number]  [facility] CM HTA HRA Screening call  PCP: Dr. Drema Dallas  Successful telephone outreach to Audrey Sweeney, 72 y/o female with history including, but not limited to, diverticulosis, chronic lymphocytic leukemia (CLL); rheumatoid arthritis, and sinusitis.  HIPAA/ identity verified during phone call today, and purpose of call was discussed with patient.  Patient reports she just returned home from visiting with her ENT provider; states that she is regularly followed by ENT for chronic sinusitis.  Reports questionable history of CLL, said that she is followed by cancer center annually for evaluation/ assessment.  Reports last visit with oncologist in June 2019 and with PCP in April 2019.  Patient sounds to be in no apparent distress throughout phone call today.   Patient reports today: -- denies need for transportation services- drives self to provider appointments, reports husband can assist as and if needed -- independently manages self-care for ADL's and IADL's -- no recent falls; no current use of/ need for DME in home -- Supportive family members/ local family and church network that are able to assist with care needs as indicated; patient reports this is "never needed," and that she believes her overall state of health is "excellent" -- verbalizes a good understanding of his current medications; takes only 3 prescribed medications and self-manages both without assistance from others; denies concerns with affording medications or with side effects -- does not have HCPOA, nor living will; declines additional information today, stating that she and her husband plan to go to their established attorney for creation of these documents.  -- declines need for ongoing outreach from [place] team today   Discussed [facility] CM services available to patient through insurance provider; patient declines need for services at  this time, however, is agreeable to my placing [facility] CM packet/ nurse advice line magnet in mail to her; encouraged patient to contact [facility] CM in the future if needs arise, and patient verbalizes agreement.   Plan:   Will place [facility] CM welcome letter and packet in mail to patient   Oneta Rack, RN, BSN, [place] Coordinator [facility] Care Management  [phone]

## 2018-03-15 ENCOUNTER — Other Ambulatory Visit (INDEPENDENT_AMBULATORY_CARE_PROVIDER_SITE_OTHER): Payer: Self-pay | Admitting: Otolaryngology

## 2018-03-15 DIAGNOSIS — J329 Chronic sinusitis, unspecified: Secondary | ICD-10-CM

## 2018-03-22 ENCOUNTER — Other Ambulatory Visit: Payer: PPO

## 2018-03-23 DIAGNOSIS — Z8709 Personal history of other diseases of the respiratory system: Secondary | ICD-10-CM | POA: Diagnosis not present

## 2018-03-23 DIAGNOSIS — C9110 Chronic lymphocytic leukemia of B-cell type not having achieved remission: Secondary | ICD-10-CM | POA: Diagnosis not present

## 2018-04-05 ENCOUNTER — Other Ambulatory Visit: Payer: PPO

## 2018-04-09 ENCOUNTER — Ambulatory Visit
Admission: RE | Admit: 2018-04-09 | Discharge: 2018-04-09 | Disposition: A | Discharge status: Home / Self Care | Payer: PPO | Source: Ambulatory Visit | Attending: Otolaryngology | Admitting: Otolaryngology

## 2018-04-09 ENCOUNTER — Other Ambulatory Visit: Payer: PPO

## 2018-04-09 DIAGNOSIS — J329 Chronic sinusitis, unspecified: Secondary | ICD-10-CM | POA: Diagnosis not present

## 2018-04-26 ENCOUNTER — Other Ambulatory Visit: Payer: PPO

## 2018-04-26 ENCOUNTER — Ambulatory Visit: Payer: PPO | Admitting: Hematology and Oncology

## 2018-05-02 DIAGNOSIS — J342 Deviated nasal septum: Secondary | ICD-10-CM | POA: Diagnosis not present

## 2018-05-02 DIAGNOSIS — J343 Hypertrophy of nasal turbinates: Secondary | ICD-10-CM | POA: Diagnosis not present

## 2018-05-02 DIAGNOSIS — J310 Chronic rhinitis: Secondary | ICD-10-CM | POA: Diagnosis not present

## 2018-05-04 ENCOUNTER — Ambulatory Visit: Payer: PPO | Admitting: Hematology and Oncology

## 2018-05-04 ENCOUNTER — Encounter: Payer: Self-pay | Admitting: Hematology and Oncology

## 2018-05-04 ENCOUNTER — Inpatient Hospital Stay: Payer: PPO | Attending: Hematology and Oncology

## 2018-05-04 ENCOUNTER — Telehealth: Payer: Self-pay | Admitting: Hematology and Oncology

## 2018-05-04 ENCOUNTER — Inpatient Hospital Stay (HOSPITAL_BASED_OUTPATIENT_CLINIC_OR_DEPARTMENT_OTHER): Payer: PPO | Admitting: Hematology and Oncology

## 2018-05-04 DIAGNOSIS — D801 Nonfamilial hypogammaglobulinemia: Secondary | ICD-10-CM

## 2018-05-04 DIAGNOSIS — Z79899 Other long term (current) drug therapy: Secondary | ICD-10-CM

## 2018-05-04 DIAGNOSIS — C9110 Chronic lymphocytic leukemia of B-cell type not having achieved remission: Secondary | ICD-10-CM | POA: Diagnosis not present

## 2018-05-04 DIAGNOSIS — J342 Deviated nasal septum: Secondary | ICD-10-CM | POA: Diagnosis not present

## 2018-05-04 LAB — CBC WITH DIFFERENTIAL/PLATELET
Basophils Absolute: 0 10*3/uL (ref 0.0–0.1)
Basophils Relative: 0 %
Eosinophils Absolute: 0.1 10*3/uL (ref 0.0–0.5)
Eosinophils Relative: 2 %
HCT: 35.4 % (ref 34.8–46.6)
Hemoglobin: 12 g/dL (ref 11.6–15.9)
Lymphocytes Relative: 60 %
Lymphs Abs: 3.9 10*3/uL — ABNORMAL HIGH (ref 0.9–3.3)
MCH: 29.2 pg (ref 25.1–34.0)
MCHC: 33.9 g/dL (ref 31.5–36.0)
MCV: 86.1 fL (ref 79.5–101.0)
Monocytes Absolute: 0.3 10*3/uL (ref 0.1–0.9)
Monocytes Relative: 4 %
Neutro Abs: 2.2 10*3/uL (ref 1.5–6.5)
Neutrophils Relative %: 34 %
Platelets: 217 10*3/uL (ref 145–400)
RBC: 4.11 MIL/uL (ref 3.70–5.45)
RDW: 12.5 % (ref 11.2–14.5)
WBC: 6.5 10*3/uL (ref 3.9–10.3)

## 2018-05-04 LAB — COMPREHENSIVE METABOLIC PANEL
ALT: 20 U/L (ref 0–44)
AST: 19 U/L (ref 15–41)
Albumin: 4.2 g/dL (ref 3.5–5.0)
Alkaline Phosphatase: 108 U/L (ref 38–126)
Anion gap: 7 (ref 5–15)
BUN: 13 mg/dL (ref 8–23)
CO2: 28 mmol/L (ref 22–32)
Calcium: 9.8 mg/dL (ref 8.9–10.3)
Chloride: 106 mmol/L (ref 98–111)
Creatinine, Ser: 0.72 mg/dL (ref 0.44–1.00)
GFR calc Af Amer: >60 mL/min (ref >60)
GFR calc non Af Amer: >60 mL/min (ref >60)
Glucose, Bld: 92 mg/dL (ref 70–99)
Potassium: 4 mmol/L (ref 3.5–5.1)
Sodium: 141 mmol/L (ref 135–145)
Total Bilirubin: 0.6 mg/dL (ref 0.3–1.2)
Total Protein: 6.8 g/dL (ref 6.5–8.1)

## 2018-05-04 NOTE — Assessment & Plan Note (Signed)
The patient is known to have acquired hypogammaglobulinemia secondary to CLL She has history of recurrent infection but not recently I educated the patient signs and symptoms to watch out for We discussed the process of immunoglobulin infusion and the patient will call me if needed She has been vaccinated with influenza vaccination recently

## 2018-05-04 NOTE — Assessment & Plan Note (Signed)
From the CBC standpoint, her total white count is still improving She had no signs of anemia, thrombocytopenia or recent infection I plan to see her back in 6 months for further follow-up

## 2018-05-04 NOTE — Telephone Encounter (Signed)
Gave pt avs and calendar  °

## 2018-05-04 NOTE — Progress Notes (Signed)
[facility] OFFICE PROGRESS NOTE  Patient Care Team: Leighton Ruff, MD as PCP - General (Family Medicine) Heath Lark, MD as Consulting Physician (Hematology and Oncology)  ASSESSMENT & PLAN:  CLL (chronic lymphocytic leukemia) ([place]) From the CBC standpoint, her total white count is still improving She had no signs of anemia, thrombocytopenia or recent infection I plan to see her back in 6 months for further follow-up  Acquired hypogammaglobulinemia [facility]) The patient is known to have acquired hypogammaglobulinemia secondary to CLL She has history of recurrent infection but not recently I educated the patient signs and symptoms to watch out for We discussed the process of immunoglobulin infusion and the patient will call me if needed She has been vaccinated with influenza vaccination recently   No orders of the defined types were placed in this encounter.   INTERVAL HISTORY: Please see below for problem oriented charting. She returns for further follow-up She denies recent infection recently No new lymphadenopathy Her appetite is good. She has received influenza vaccination recently  SUMMARY OF ONCOLOGIC HISTORY:   CLL (chronic lymphocytic leukemia) ([place])   02/24/2004 Pathology Results     Case #: [id number] confirmed CLL    01/23/2017 Pathology Results    Peripheral Blood Flow Cytometry - CD5-POSITIVE MONOCLONAL B CELL POPULATION MOST CONSISTENT WITH CHRONIC LYMPHOCYTIC LEUKEMIA - SEE COMMENT Diagnosis Comment: Examination of the peripheral blood reveals a lymphocytosis with frequent small lymphocytes that have clumped chromatin and scant cytoplasm . Larger cells with prominent nucleoli are not identified. By flow cytometry, a B-cell population that expresses CD5, CD19, CD20, CD21, CD22, HLA-DR and CD23 comprises 46% of all lymphocytes. Overall, the morphology and immunophenotype are consistent with the patient's history of chronic lymphocytic leukemia    01/23/2017 Pathology Results    FISh confirmed deletion 13 q     01/23/2018 Cancer Staging    Staging form: Chronic Lymphocytic Leukemia / Small Lymphocytic Lymphoma, AJCC 8th Edition - Clinical: Modified Rai Stage 0 (Modified Rai risk: Low, Lymphocytosis: Present, Adenopathy: Absent, Organomegaly: Absent, Anemia: Absent, Thrombocytopenia: Absent) - Signed by Heath Lark, MD on 01/23/2018     REVIEW OF SYSTEMS:   Constitutional: Denies fevers, chills or abnormal weight loss Eyes: Denies blurriness of vision Ears, nose, mouth, throat, and face: Denies mucositis or sore throat Respiratory: Denies cough, dyspnea or wheezes Cardiovascular: Denies palpitation, chest discomfort or lower extremity swelling Gastrointestinal:  Denies nausea, heartburn or change in bowel habits Skin: Denies abnormal skin rashes Lymphatics: Denies new lymphadenopathy or easy bruising Neurological:Denies numbness, tingling or new weaknesses Behavioral/Psych: Mood is stable, no new changes  All other systems were reviewed with the patient and are negative.  I have reviewed the past medical history, past surgical history, social history and family history with the patient and they are unchanged from previous note.  ALLERGIES:  is allergic to contrast media [iodinated diagnostic agents]; provera [medroxyprogesterone acetate]; and sulfa antibiotics.  MEDICATIONS:  Current Outpatient Medications  Medication Sig Dispense Refill  . Acetaminophen (TYLENOL PO) Take by mouth.    . cetirizine (ZYRTEC) 10 MG tablet Take 10 mg by mouth as needed.    . Estradiol (YUVAFEM) 10 MCG TABS vaginal tablet Insert one vaginally twice weekly 8 tablet 11  . hydrochlorothiazide (HYDRODIURIL) 12.5 MG tablet Take 12.5 mg by mouth daily.    . nepafenac (ILEVRO) 0.3 % ophthalmic suspension Place 1 drop into both eyes daily.    Vladimir Faster Glycol-Propyl Glycol (SYSTANE OP) Apply to eye.    . prednisoLONE  acetate (PRED FORTE) 1 % ophthalmic  suspension Place 1 drop into both eyes 4 (four) times daily.     No current facility-administered medications for this visit.     PHYSICAL EXAMINATION: ECOG PERFORMANCE STATUS: 0 - Asymptomatic  Vitals:   05/04/18 1327  BP: (!) 115/53  Pulse: 77  Resp: 18  Temp: 97.8 F (36.6 C)  SpO2: 100%   Filed Weights   05/04/18 1327  Weight: 141 lb 4.8 oz (64.1 kg)    GENERAL:alert, no distress and comfortable Musculoskeletal:no cyanosis of digits and no clubbing  NEURO: alert & oriented x 3 with fluent speech, no focal motor/sensory deficits  LABORATORY DATA:  I have reviewed the data as listed    Component Value Date/Time   NA 141 05/04/2018 1249   NA 139 08/25/2014 1309   K 4.0 05/04/2018 1249   K 3.8 08/25/2014 1309   CL 106 05/04/2018 1249   CL 102 08/29/2012 0934   CO2 28 05/04/2018 1249   CO2 23 08/25/2014 1309   GLUCOSE 92 05/04/2018 1249   GLUCOSE 99 08/25/2014 1309   GLUCOSE 100 (H) 08/29/2012 0934   BUN 13 05/04/2018 1249   BUN 13.8 08/25/2014 1309   CREATININE 0.72 05/04/2018 1249   CREATININE 0.6 08/25/2014 1309   CALCIUM 9.8 05/04/2018 1249   CALCIUM 9.8 08/25/2014 1309   PROT 6.8 05/04/2018 1249   PROT 6.8 08/25/2014 1309   ALBUMIN 4.2 05/04/2018 1249   ALBUMIN 4.4 08/25/2014 1309   AST 19 05/04/2018 1249   AST 18 08/25/2014 1309   ALT 20 05/04/2018 1249   ALT 21 08/25/2014 1309   ALKPHOS 108 05/04/2018 1249   ALKPHOS 111 08/25/2014 1309   BILITOT 0.6 05/04/2018 1249   BILITOT 0.54 08/25/2014 1309   GFRNONAA >60 05/04/2018 1249   GFRAA >60 05/04/2018 1249    No results found for: SPEP, UPEP  Lab Results  Component Value Date   WBC 6.5 05/04/2018   NEUTROABS 2.2 05/04/2018   HGB 12.0 05/04/2018   HCT 35.4 05/04/2018   MCV 86.1 05/04/2018   PLT 217 05/04/2018      Chemistry      Component Value Date/Time   NA 141 05/04/2018 1249   NA 139 08/25/2014 1309   K 4.0 05/04/2018 1249   K 3.8 08/25/2014 1309   CL 106 05/04/2018 1249   CL  102 08/29/2012 0934   CO2 28 05/04/2018 1249   CO2 23 08/25/2014 1309   BUN 13 05/04/2018 1249   BUN 13.8 08/25/2014 1309   CREATININE 0.72 05/04/2018 1249   CREATININE 0.6 08/25/2014 1309      Component Value Date/Time   CALCIUM 9.8 05/04/2018 1249   CALCIUM 9.8 08/25/2014 1309   ALKPHOS 108 05/04/2018 1249   ALKPHOS 111 08/25/2014 1309   AST 19 05/04/2018 1249   AST 18 08/25/2014 1309   ALT 20 05/04/2018 1249   ALT 21 08/25/2014 1309   BILITOT 0.6 05/04/2018 1249   BILITOT 0.54 08/25/2014 1309       RADIOGRAPHIC STUDIES: I have personally reviewed the radiological images as listed and agreed with the findings in the report. Ct Maxillofacial Wo Contrast  Result Date: 04/10/2018 CLINICAL DATA:  Chronic sinusitis. EXAM: CT MAXILLOFACIAL WITHOUT CONTRAST TECHNIQUE: Multidetector CT images of the paranasal sinuses were obtained using the standard protocol without intravenous contrast. COMPARISON:  Neck CT 03/19/2004 FINDINGS: Paranasal sinuses: Frontal: Trace mucosal thickening near the superior aspect of the left frontal recess. Ethmoid: Clear. Maxillary:  Trace left maxillary sinus mucosal thickening with a 4 mm mucous retention cyst. Clear right maxillary sinus. Sphenoid: Clear.  Patent sphenoethmoidal recesses. Right ostiomeatal unit: Patent. Left ostiomeatal unit: Patent. Nasal passages: Patent. 3 mm leftward nasal septal deviation. Anatomy: Minimal pneumatization superior to the left anterior ethmoid notch. Intact olfactory grooves and fovea ethmoidalis, Keros I. Sellar sphenoid pneumatization pattern. No dehiscence of carotid or optic canals. No onodi cell. Other: Slightly scalloped appearance of the dorsal clivus superiorly over a length of 1 cm, likely unchanged from 2005, benign in appearance, and possibly reflecting incidental ecchordosis physaliphora (notochordal remnant). Clear tympanic cavities and mastoid air cells. Bilateral cataract extraction. Unremarkable visualized portion  of the brain. IMPRESSION: No evidence of significant inflammatory paranasal sinus disease. Patent sinus drainage pathways. Electronically Signed   By: Logan Bores M.D.   On: 04/10/2018 09:53    All questions were answered. The patient knows to call the clinic with any problems, questions or concerns. No barriers to learning was detected.  I spent 10 minutes counseling the patient face to face. The total time spent in the appointment was 15 minutes and more than 50% was on counseling and review of test results  Heath Lark, MD 05/04/2018 2:45 PM

## 2018-05-05 LAB — IGG, IGA, IGM
IgA: 97 mg/dL (ref 64–422)
IgG (Immunoglobin G), Serum: 597 mg/dL — ABNORMAL LOW (ref 700–1600)
IgM (Immunoglobulin M), Srm: 55 mg/dL (ref 26–217)

## 2018-05-07 ENCOUNTER — Telehealth: Payer: Self-pay

## 2018-05-07 NOTE — Telephone Encounter (Signed)
Called and given below message. Verbalized understanding. 

## 2018-05-07 NOTE — Telephone Encounter (Signed)
-----   Message from Heath Lark, MD sent at 05/07/2018  8:44 AM EDT ----- Regarding: IgG level pls call her and let her know level is low but similar to 10 years ago ----- Message ----- From: Interface, Lab In [place] Sent: 05/04/2018   1:16 PM EDT To: Heath Lark, MD

## 2018-05-10 ENCOUNTER — Encounter: Payer: Self-pay | Admitting: Podiatry

## 2018-05-10 ENCOUNTER — Ambulatory Visit: Payer: PPO | Admitting: Podiatry

## 2018-05-10 ENCOUNTER — Telehealth: Payer: Self-pay | Admitting: *Deleted

## 2018-05-10 DIAGNOSIS — M778 Other enthesopathies, not elsewhere classified: Secondary | ICD-10-CM

## 2018-05-10 DIAGNOSIS — M779 Enthesopathy, unspecified: Secondary | ICD-10-CM | POA: Diagnosis not present

## 2018-05-10 MED ORDER — DICLOFENAC SODIUM 1 % TD GEL: 4.0000 g | Freq: Four times a day (QID) | TRANSDERMAL | 3 refills | Status: DC

## 2018-05-10 NOTE — Telephone Encounter (Signed)
Orders faxed to [facility] - Main Scheduling-Radiology.

## 2018-05-10 NOTE — Telephone Encounter (Signed)
-----   Message from Rip Harbour, [facility] sent at 05/10/2018  2:04 PM EDT ----- Regarding: MRI MRI left foot - evaluate tear of 2nd and 3rd MPJ capsule left foot - surgical consideration

## 2018-05-10 NOTE — Progress Notes (Signed)
She presents today for follow-up of capsulitis left states that it was a lot better until the cortisone shot wore off.  She states that she is crazy painful right in here she points to the second and third metatarsal phalangeal joints of the left foot.  Objective: Vital signs are stable alert and oriented x3.  Pulses are palpable.  Neurologic sensorium is intact degenerative flexors are intact muscle strength is normal symmetrical.  Cavus foot deformity resulting in dislocation of the second third metatarsal phalangeal joints with soft tissue swelling lateral aspect of the foot.  It appears that there is a capsular tear and a dislocation of the toes.   Assessment: Capsulitis letter plate tear second and third digits left foot hammertoe deformity.  Plan: I am requesting an MRI for surgical evaluation and possible repair.  I wrote a prescription for Voltaren gel to be applied multiple times daily.

## 2018-05-15 DIAGNOSIS — H04123 Dry eye syndrome of bilateral lacrimal glands: Secondary | ICD-10-CM | POA: Diagnosis not present

## 2018-05-15 DIAGNOSIS — H26491 Other secondary cataract, right eye: Secondary | ICD-10-CM | POA: Diagnosis not present

## 2018-05-15 DIAGNOSIS — H1852 Epithelial (juvenile) corneal dystrophy: Secondary | ICD-10-CM | POA: Diagnosis not present

## 2018-05-15 DIAGNOSIS — Z961 Presence of intraocular lens: Secondary | ICD-10-CM | POA: Diagnosis not present

## 2018-05-18 ENCOUNTER — Ambulatory Visit (HOSPITAL_COMMUNITY)
Admission: RE | Admit: 2018-05-18 | Discharge: 2018-05-18 | Disposition: A | Discharge status: Home / Self Care | Payer: PPO | Source: Ambulatory Visit | Attending: Podiatry | Admitting: Podiatry

## 2018-05-18 DIAGNOSIS — M779 Enthesopathy, unspecified: Secondary | ICD-10-CM | POA: Insufficient documentation

## 2018-05-18 DIAGNOSIS — X58XXXA Exposure to other specified factors, initial encounter: Secondary | ICD-10-CM | POA: Insufficient documentation

## 2018-05-18 DIAGNOSIS — M19072 Primary osteoarthritis, left ankle and foot: Secondary | ICD-10-CM | POA: Insufficient documentation

## 2018-05-18 DIAGNOSIS — S93125A Dislocation of metatarsophalangeal joint of left lesser toe(s), initial encounter: Secondary | ICD-10-CM | POA: Insufficient documentation

## 2018-05-18 DIAGNOSIS — S93105A Unspecified dislocation of left toe(s), initial encounter: Secondary | ICD-10-CM | POA: Diagnosis not present

## 2018-06-01 DIAGNOSIS — J069 Acute upper respiratory infection, unspecified: Secondary | ICD-10-CM | POA: Diagnosis not present

## 2018-06-01 DIAGNOSIS — R6889 Other general symptoms and signs: Secondary | ICD-10-CM | POA: Diagnosis not present

## 2018-06-06 DIAGNOSIS — J343 Hypertrophy of nasal turbinates: Secondary | ICD-10-CM | POA: Diagnosis not present

## 2018-06-06 DIAGNOSIS — J310 Chronic rhinitis: Secondary | ICD-10-CM | POA: Diagnosis not present

## 2018-06-07 ENCOUNTER — Ambulatory Visit: Payer: PPO | Admitting: Podiatry

## 2018-06-12 DIAGNOSIS — H6062 Unspecified chronic otitis externa, left ear: Secondary | ICD-10-CM | POA: Diagnosis not present

## 2018-06-12 DIAGNOSIS — J320 Chronic maxillary sinusitis: Secondary | ICD-10-CM | POA: Diagnosis not present

## 2018-06-12 DIAGNOSIS — J370 Chronic laryngitis: Secondary | ICD-10-CM | POA: Diagnosis not present

## 2018-06-12 DIAGNOSIS — J322 Chronic ethmoidal sinusitis: Secondary | ICD-10-CM | POA: Diagnosis not present

## 2018-06-12 DIAGNOSIS — H6522 Chronic serous otitis media, left ear: Secondary | ICD-10-CM | POA: Diagnosis not present

## 2018-06-20 DIAGNOSIS — H8102 Meniere's disease, left ear: Secondary | ICD-10-CM | POA: Diagnosis not present

## 2018-06-20 DIAGNOSIS — H6521 Chronic serous otitis media, right ear: Secondary | ICD-10-CM | POA: Diagnosis not present

## 2018-06-20 DIAGNOSIS — H902 Conductive hearing loss, unspecified: Secondary | ICD-10-CM | POA: Diagnosis not present

## 2018-06-21 ENCOUNTER — Ambulatory Visit: Payer: PPO | Admitting: Podiatry

## 2018-07-03 ENCOUNTER — Ambulatory Visit: Payer: PPO | Admitting: Podiatry

## 2018-07-03 ENCOUNTER — Encounter: Payer: Self-pay | Admitting: Podiatry

## 2018-07-03 DIAGNOSIS — M779 Enthesopathy, unspecified: Secondary | ICD-10-CM | POA: Diagnosis not present

## 2018-07-03 DIAGNOSIS — M778 Other enthesopathies, not elsewhere classified: Secondary | ICD-10-CM

## 2018-07-03 DIAGNOSIS — H04123 Dry eye syndrome of bilateral lacrimal glands: Secondary | ICD-10-CM | POA: Diagnosis not present

## 2018-07-04 NOTE — Progress Notes (Signed)
Presents for her MRI results today.  States that she still in a lot of pain particularly in the mornings.  Objective: Vital signs are stable alert and oriented x3 MRI does demonstrate severe dislocation and rupture of the capsule third metatarsal phalangeal joint left foot.  Assessment: Hammertoe deformity rule with rupture of the plantar plate and capsule with dislocation of the third metatarsal phalangeal joint.  Plan: Recommended surgical intervention or orthotics.  She would like to try orthotics at this point.  Because of her insurance she would like for [place] to see if they will cover them.  She will be getting a call from our lab to confirm benefits.  I handed the paperwork to [place] this afternoon.

## 2018-08-22 ENCOUNTER — Other Ambulatory Visit: Payer: Self-pay | Admitting: Certified Nurse Midwife

## 2018-08-22 DIAGNOSIS — Z1231 Encounter for screening mammogram for malignant neoplasm of breast: Secondary | ICD-10-CM

## 2018-09-11 DIAGNOSIS — J019 Acute sinusitis, unspecified: Secondary | ICD-10-CM | POA: Diagnosis not present

## 2018-09-11 DIAGNOSIS — J029 Acute pharyngitis, unspecified: Secondary | ICD-10-CM | POA: Diagnosis not present

## 2018-10-09 ENCOUNTER — Ambulatory Visit: Payer: PPO

## 2018-10-23 ENCOUNTER — Telehealth: Payer: Self-pay

## 2018-10-23 NOTE — Telephone Encounter (Signed)
Spoke with pt by phone and gave new appt date and time for July

## 2018-10-31 ENCOUNTER — Ambulatory Visit: Payer: PPO

## 2018-11-02 ENCOUNTER — Ambulatory Visit: Payer: PPO | Admitting: Hematology and Oncology

## 2018-11-02 ENCOUNTER — Other Ambulatory Visit: Payer: PPO

## 2018-12-12 ENCOUNTER — Ambulatory Visit: Payer: PPO | Admitting: Certified Nurse Midwife

## 2018-12-14 DIAGNOSIS — H35033 Hypertensive retinopathy, bilateral: Secondary | ICD-10-CM | POA: Diagnosis not present

## 2018-12-14 DIAGNOSIS — H04123 Dry eye syndrome of bilateral lacrimal glands: Secondary | ICD-10-CM | POA: Diagnosis not present

## 2018-12-14 DIAGNOSIS — H2640 Unspecified secondary cataract: Secondary | ICD-10-CM | POA: Diagnosis not present

## 2018-12-14 DIAGNOSIS — Z961 Presence of intraocular lens: Secondary | ICD-10-CM | POA: Diagnosis not present

## 2018-12-14 DIAGNOSIS — H3554 Dystrophies primarily involving the retinal pigment epithelium: Secondary | ICD-10-CM | POA: Diagnosis not present

## 2018-12-17 ENCOUNTER — Ambulatory Visit: Payer: PPO

## 2018-12-28 ENCOUNTER — Other Ambulatory Visit: Payer: Self-pay

## 2018-12-28 ENCOUNTER — Ambulatory Visit (INDEPENDENT_AMBULATORY_CARE_PROVIDER_SITE_OTHER): Payer: PPO | Admitting: Obstetrics & Gynecology

## 2018-12-28 ENCOUNTER — Telehealth: Payer: Self-pay | Admitting: Certified Nurse Midwife

## 2018-12-28 ENCOUNTER — Encounter: Payer: Self-pay | Admitting: Obstetrics & Gynecology

## 2018-12-28 VITALS — BP 138/60 | HR 98 | Temp 97.4°F | Ht 65.25 in | Wt 138.0 lb

## 2018-12-28 DIAGNOSIS — N898 Other specified noninflammatory disorders of vagina: Secondary | ICD-10-CM | POA: Diagnosis not present

## 2018-12-28 MED ORDER — ESTRADIOL 0.1 MG/GM VA CREA: TOPICAL_CREAM | VAGINAL | 6 refills | Status: DC

## 2018-12-28 MED ORDER — TERCONAZOLE 0.4 % VA CREA: 1.0000 | TOPICAL_CREAM | Freq: Every day | VAGINAL | 0 refills | Status: DC

## 2018-12-28 NOTE — Progress Notes (Signed)
GYNECOLOGY  VISIT  CC:   Vaginal itching   HPI: 73 y.o. G3P3 Married White or Caucasian female here for vaginal itching and irritation x 2 weeks.  She reports this is internal and there is yellowish discharge.  Has used vagifem in the past.  She restarted the vagifem about one month ago.  Does have vaginal dryness but feels the vaginal estradiol really doesn't help.  She did change laundry detergent so she is not sure that this started.  Would like to consider something else for vaginal dryness.    Denies vaginal bleeding.  GYNECOLOGIC HISTORY: Patient's last menstrual period was 08/01/2006. Contraception: BTL Menopausal hormone therapy: Yuvafem  Patient Active Problem List   Diagnosis Date Noted  . Chronic sinusitis, unspecified 01/23/2018  . Acquired hypogammaglobulinemia ([place]) 01/23/2018  . Diverticulosis 03/10/2017  . Family history of colon cancer 03/10/2017  . History of adenomatous polyp of colon 03/10/2017  . RA (rheumatoid arthritis) ([place]) 03/10/2017  . Fatty infiltration of liver 01/23/2017  . CLL (chronic lymphocytic leukemia) ([place]) 11/13/2006    Past Medical History:  Diagnosis Date  . Abnormal Pap smear of cervix    ?  Marland Kitchen Arthritis   . C. difficile colitis   . CLL (chronic lymphocytic leukemia) ([facility])    Rai stage 0.  . Depression   . Hypertension   . IBS (irritable bowel syndrome)   . Osteopenia   . Rheumatoid arthritis(714.0)     Past Surgical History:  Procedure Laterality Date  . CESAREAN SECTION    . COLONOSCOPY W/ POLYPECTOMY     Pre-cancerous polyps removed.  . ENDOMETRIAL BIOPSY     8/98  . TUBAL LIGATION      MEDS:   Current Outpatient Medications on File Prior to Visit  Medication Sig Dispense Refill  . Acetaminophen (TYLENOL PO) Take by mouth.    . Estradiol (YUVAFEM) 10 MCG TABS vaginal tablet Insert one vaginally twice weekly 8 tablet 11  . hydrochlorothiazide (HYDRODIURIL) 25 MG tablet Take 0.5 tablets by mouth daily.    Marland Kitchen olopatadine  (PATANOL) 0.1 % ophthalmic solution INSTILL 1 DROP INTO BOTH EYES TWICE A DAY  3   No current facility-administered medications on file prior to visit.     ALLERGIES: Contrast media [iodinated diagnostic agents]; Provera [medroxyprogesterone acetate]; and Sulfa antibiotics  Family History  Problem Relation Age of Onset  . Colon cancer Mother   . Stroke Father   . Diabetes Father   . Heart disease Father   . Hypertension Father   . Heart disease Maternal Grandmother   . Diabetes Maternal Grandmother   . Cancer Paternal Grandmother        female cancer  . Emphysema Maternal Grandfather   . Breast cancer Cousin        passed away age 39    SH:  Married, non smoker  Review of Systems  Genitourinary:       Vaginal itching   All other systems reviewed and are negative.   PHYSICAL EXAMINATION:    BP 138/60   Pulse 98   Temp (!) 97.4 F (36.3 C) (Temporal)   Ht 5' 5.25" (1.657 m)   Wt 138 lb (62.6 kg)   LMP 08/01/2006   BMI 22.79 kg/m     General appearance: alert, cooperative and appears stated age Lymph:  no inguinal LAD noted  Pelvic: External genitalia:  Erythema of lower external labia majora  Urethra:  normal appearing urethra with no masses, tenderness or lesions              Bartholins and Skenes: normal                 Vagina: normal vaginal mucosa, no lesions, watery white discharge present              Cervix: no lesions              Bimanual Exam:  Uterus:  normal size, contour, position, consistency, mobility, non-tender               Adnexa: no mass, fullness, tenderness  Chaperone was present for exam.  Assessment: Vaginal/vulvar itching Vaginal discharge Vaginal dryness in the past with vagifem use that doesn't really help  Plan: Affirm pending Terazol 7 nightly x 7 nights Trial of estrace vaginal cream 1 gm pv twice weekly.

## 2018-12-28 NOTE — Telephone Encounter (Signed)
Spoke with patient. Reports vaginal itching with yellow vaginal d/c and odor that started 1-2 wks ago. Restarted vagifem approximately 1 mo ago, does not feel the vagifem is helping for vaginal dryness. Changed to a new laundry detergent. Denies vaginal bleeding or pain. Covid 19 pre-screen negative, reviewed Covid19 precautions. Patient is aware [name], CNM is out of the office, agreeable to OV with covering provider. Patient request to schedule with Dr. Sabra Heck. OV scheduled for today at 10am.   Last AEX 12/08/17 with DL  Patient verbalizes understanding and is agreeable.  Encounter closed.

## 2018-12-28 NOTE — Telephone Encounter (Signed)
Patient is having vaginal irritation. Would like to speak with a nurse. Stopped using vagifem for awhile, but just started using it again. Has been bothering her for about a week and a half. Unsure if it has anything to do with vagifem or not.

## 2018-12-29 LAB — VAGINITIS/VAGINOSIS, DNA PROBE
Candida Species: POSITIVE — AB
Gardnerella vaginalis: NEGATIVE
Trichomonas vaginosis: NEGATIVE

## 2018-12-31 DIAGNOSIS — E559 Vitamin D deficiency, unspecified: Secondary | ICD-10-CM | POA: Diagnosis not present

## 2018-12-31 DIAGNOSIS — E7800 Pure hypercholesterolemia, unspecified: Secondary | ICD-10-CM | POA: Diagnosis not present

## 2018-12-31 DIAGNOSIS — Z0000 Encounter for general adult medical examination without abnormal findings: Secondary | ICD-10-CM | POA: Diagnosis not present

## 2019-01-02 ENCOUNTER — Other Ambulatory Visit: Payer: Self-pay | Admitting: Family Medicine

## 2019-01-02 DIAGNOSIS — E2839 Other primary ovarian failure: Secondary | ICD-10-CM

## 2019-01-04 ENCOUNTER — Telehealth: Payer: Self-pay | Admitting: Obstetrics & Gynecology

## 2019-01-04 NOTE — Telephone Encounter (Signed)
Patient has question regarding refill for estradiol cream. Would like prescription to be compounded with Vitamin E as discussed with Dr. Sabra Heck, but prescription was not written that way. [place] [facility] has sent multiple requests since last week.

## 2019-01-04 NOTE — Telephone Encounter (Signed)
Audrey Sweeney has rx and will fax on Monday.  Ok to close encounter.

## 2019-01-04 NOTE — Telephone Encounter (Signed)
Patient called stating yeast infection much better with Terazol. She had requested Rx for Estrace w/Vit E for vaginal dryness to be sent to [facility] but they haven't received this yet(faxed rx is on Dr.Miller's desk for approval). Advised will discuss with Dr.Miller and let her know. Routed to provider.

## 2019-01-07 NOTE — Telephone Encounter (Signed)
Called patient and per DPR, left message stating we faxed Rx this morning.

## 2019-01-07 NOTE — Telephone Encounter (Signed)
Patient is calling regarding prescription.

## 2019-01-08 DIAGNOSIS — C9110 Chronic lymphocytic leukemia of B-cell type not having achieved remission: Secondary | ICD-10-CM | POA: Diagnosis not present

## 2019-01-08 DIAGNOSIS — I10 Essential (primary) hypertension: Secondary | ICD-10-CM | POA: Diagnosis not present

## 2019-01-08 DIAGNOSIS — E7800 Pure hypercholesterolemia, unspecified: Secondary | ICD-10-CM | POA: Diagnosis not present

## 2019-01-08 DIAGNOSIS — E559 Vitamin D deficiency, unspecified: Secondary | ICD-10-CM | POA: Diagnosis not present

## 2019-01-08 DIAGNOSIS — J302 Other seasonal allergic rhinitis: Secondary | ICD-10-CM | POA: Diagnosis not present

## 2019-01-18 DIAGNOSIS — L989 Disorder of the skin and subcutaneous tissue, unspecified: Secondary | ICD-10-CM | POA: Diagnosis not present

## 2019-01-22 ENCOUNTER — Other Ambulatory Visit (HOSPITAL_COMMUNITY)
Admission: RE | Admit: 2019-01-22 | Discharge: 2019-01-22 | Disposition: A | Discharge status: Home / Self Care | Payer: PPO | Source: Ambulatory Visit | Attending: Certified Nurse Midwife | Admitting: Certified Nurse Midwife

## 2019-01-22 ENCOUNTER — Encounter: Payer: Self-pay | Admitting: Certified Nurse Midwife

## 2019-01-22 ENCOUNTER — Ambulatory Visit (INDEPENDENT_AMBULATORY_CARE_PROVIDER_SITE_OTHER): Payer: PPO | Admitting: Certified Nurse Midwife

## 2019-01-22 ENCOUNTER — Other Ambulatory Visit: Payer: Self-pay

## 2019-01-22 VITALS — BP 120/74 | HR 68 | Temp 97.6°F | Resp 16 | Ht 65.0 in | Wt 139.0 lb

## 2019-01-22 DIAGNOSIS — N952 Postmenopausal atrophic vaginitis: Secondary | ICD-10-CM | POA: Insufficient documentation

## 2019-01-22 DIAGNOSIS — Z124 Encounter for screening for malignant neoplasm of cervix: Secondary | ICD-10-CM | POA: Insufficient documentation

## 2019-01-22 DIAGNOSIS — Z856 Personal history of leukemia: Secondary | ICD-10-CM | POA: Diagnosis not present

## 2019-01-22 DIAGNOSIS — Z01419 Encounter for gynecological examination (general) (routine) without abnormal findings: Secondary | ICD-10-CM | POA: Diagnosis not present

## 2019-01-22 DIAGNOSIS — Z1151 Encounter for screening for human papillomavirus (HPV): Secondary | ICD-10-CM | POA: Insufficient documentation

## 2019-01-22 NOTE — Progress Notes (Signed)
73 y.o. G3P3 Married  Caucasian Fe here for annual exam. Post menopausal no vaginal bleeding. Using compound estrogen cream for dryness and working well. Has follow up with cancer center next week, hopeful all normal with Leukemia follow up. Sees PCP for aex, labs soon. Emotional health good. No health issues today.    Patient's last menstrual period was 08/01/2006.          Sexually active: Yes.    The current method of family planning is tubal ligation.    Exercising: Yes.    walking Smoker:  no  Review of Systems  Constitutional: Negative.   HENT: Negative.   Eyes: Negative.   Respiratory: Negative.   Cardiovascular: Negative.   Gastrointestinal: Negative.   Genitourinary: Negative.   Musculoskeletal: Negative.   Skin: Negative.   Neurological: Negative.   Endo/Heme/Allergies: Negative.   Psychiatric/Behavioral: Negative.     Health Maintenance: Pap:  12-01-16 neg History of Abnormal Pap: ? MMG:  09-28-17 category b density birads 1:neg, scheduled already Self Breast exams: yes Colonoscopy:  2017 neg f/u 84yrs BMD:   2015?, scheduled already TDaP:  2018 Shingles: not done Pneumonia: 2016 Hep C and HIV: hep c neg per patient Labs: PCP   reports that she has never smoked. She has never used smokeless tobacco. She reports that she does not drink alcohol or use drugs.  Past Medical History:  Diagnosis Date  . Abnormal Pap smear of cervix    ?  Marland Kitchen Arthritis   . C. difficile colitis   . CLL (chronic lymphocytic leukemia) ([facility])    Rai stage 0.  . Depression   . Hypertension   . IBS (irritable bowel syndrome)   . Osteopenia   . Rheumatoid arthritis(714.0)     Past Surgical History:  Procedure Laterality Date  . CESAREAN SECTION    . COLONOSCOPY W/ POLYPECTOMY     Pre-cancerous polyps removed.  . ENDOMETRIAL BIOPSY     8/98  . TUBAL LIGATION      Current Outpatient Medications  Medication Sig Dispense Refill  . Acetaminophen (TYLENOL PO) Take by mouth.    .  estradiol (ESTRACE) 0.1 MG/GM vaginal cream 1 gram vaginally twice weekly 42.5 g 6  . hydrochlorothiazide (HYDRODIURIL) 25 MG tablet Take 0.5 tablets by mouth daily.    Marland Kitchen olopatadine (PATANOL) 0.1 % ophthalmic solution INSTILL 1 DROP INTO BOTH EYES TWICE A DAY  3  . terconazole (TERAZOL 7) 0.4 % vaginal cream Place 1 applicator vaginally at bedtime. 45 g 0   No current facility-administered medications for this visit.     Family History  Problem Relation Age of Onset  . Colon cancer Mother   . Stroke Father   . Diabetes Father   . Heart disease Father   . Hypertension Father   . Heart disease Maternal Grandmother   . Diabetes Maternal Grandmother   . Cancer Paternal Grandmother        female cancer  . Emphysema Maternal Grandfather   . Breast cancer Cousin        passed away age 69    ROS:  Pertinent items are noted in HPI.  Otherwise, a comprehensive ROS was negative.  Exam:   LMP 08/01/2006    Ht Readings from Last 3 Encounters:  12/28/18 5' 5.25" (1.657 m)  05/04/18 5' 5.25" (1.657 m)  01/23/18 5' 5.25" (1.657 m)    General appearance: alert, cooperative and appears stated age Head: Normocephalic, without obvious abnormality, atraumatic Neck: no adenopathy,  supple, symmetrical, trachea midline and thyroid normal to inspection and palpation Lungs: clear to auscultation bilaterally Breasts: normal appearance, no masses or tenderness, No nipple retraction or dimpling, No nipple discharge or bleeding, No axillary or supraclavicular adenopathy Heart: regular rate and rhythm Abdomen: soft, non-tender; no masses,  no organomegaly Extremities: extremities normal, atraumatic, no cyanosis or edema Skin: Skin color, texture, turgor normal. No rashes or lesions Lymph nodes: Cervical, supraclavicular, and axillary nodes normal. No abnormal inguinal nodes palpated Neurologic: Grossly normal   Pelvic: External genitalia:  no lesions              Urethra:  normal appearing urethra  with no masses, tenderness or lesions              Bartholin's and Skene's: normal                 Vagina: normal appearing vagina with normal color and discharge, no lesions              Cervix: multiparous appearance, no cervical motion tenderness and no lesions              Pap taken: Yes.   Bimanual Exam:  Uterus:  normal size, contour, position, consistency, mobility, non-tender and anteverted              Adnexa: normal adnexa and no mass, fullness, tenderness               Rectovaginal: Confirms               Anus:  normal sphincter tone, no lesions  Chaperone present: yes  A:  Well Woman with normal exam  Post menopausal no HRT  Atrophic vaginitis with estrogen compound use with good results  Leukemia under follow up with Cancer center  P:   Reviewed health and wellness pertinent to exam  Aware of need to advise if vaginal bleeding  Will send Rx once clarified to [employer] follow up as indicated  Pap smear: yes   counseled on breast self exam, mammography screening, feminine hygiene, adequate intake of calcium and vitamin D, diet and exercise  return annually or prn  An After Visit Summary was printed and given to the patient.

## 2019-01-24 LAB — CYTOLOGY - PAP
Diagnosis: NEGATIVE
HPV: NOT DETECTED

## 2019-01-28 ENCOUNTER — Ambulatory Visit
Admission: RE | Admit: 2019-01-28 | Discharge: 2019-01-28 | Disposition: A | Discharge status: Home / Self Care | Payer: PPO | Source: Ambulatory Visit | Attending: Certified Nurse Midwife | Admitting: Certified Nurse Midwife

## 2019-01-28 ENCOUNTER — Other Ambulatory Visit: Payer: Self-pay

## 2019-01-28 DIAGNOSIS — Z1231 Encounter for screening mammogram for malignant neoplasm of breast: Secondary | ICD-10-CM

## 2019-01-28 DIAGNOSIS — L821 Other seborrheic keratosis: Secondary | ICD-10-CM | POA: Diagnosis not present

## 2019-01-28 DIAGNOSIS — D229 Melanocytic nevi, unspecified: Secondary | ICD-10-CM | POA: Diagnosis not present

## 2019-01-28 DIAGNOSIS — L570 Actinic keratosis: Secondary | ICD-10-CM | POA: Diagnosis not present

## 2019-01-30 ENCOUNTER — Other Ambulatory Visit: Payer: Self-pay | Admitting: Hematology and Oncology

## 2019-01-30 ENCOUNTER — Other Ambulatory Visit: Payer: Self-pay | Admitting: Certified Nurse Midwife

## 2019-01-30 DIAGNOSIS — R928 Other abnormal and inconclusive findings on diagnostic imaging of breast: Secondary | ICD-10-CM

## 2019-01-30 DIAGNOSIS — C9110 Chronic lymphocytic leukemia of B-cell type not having achieved remission: Secondary | ICD-10-CM

## 2019-01-31 ENCOUNTER — Inpatient Hospital Stay (HOSPITAL_BASED_OUTPATIENT_CLINIC_OR_DEPARTMENT_OTHER): Payer: PPO | Admitting: Hematology and Oncology

## 2019-01-31 ENCOUNTER — Other Ambulatory Visit: Payer: Self-pay

## 2019-01-31 ENCOUNTER — Encounter: Payer: Self-pay | Admitting: Hematology and Oncology

## 2019-01-31 ENCOUNTER — Inpatient Hospital Stay: Payer: PPO | Attending: Hematology and Oncology

## 2019-01-31 DIAGNOSIS — C9110 Chronic lymphocytic leukemia of B-cell type not having achieved remission: Secondary | ICD-10-CM | POA: Insufficient documentation

## 2019-01-31 DIAGNOSIS — R928 Other abnormal and inconclusive findings on diagnostic imaging of breast: Secondary | ICD-10-CM

## 2019-01-31 DIAGNOSIS — Z791 Long term (current) use of non-steroidal anti-inflammatories (NSAID): Secondary | ICD-10-CM | POA: Insufficient documentation

## 2019-01-31 DIAGNOSIS — Z79899 Other long term (current) drug therapy: Secondary | ICD-10-CM | POA: Insufficient documentation

## 2019-01-31 LAB — CBC WITH DIFFERENTIAL/PLATELET
Abs Immature Granulocytes: 0.02 10*3/uL (ref 0.00–0.07)
Basophils Absolute: 0.1 10*3/uL (ref 0.0–0.1)
Basophils Relative: 1 %
Eosinophils Absolute: 0.1 10*3/uL (ref 0.0–0.5)
Eosinophils Relative: 1 %
HCT: 36 % (ref 36.0–46.0)
Hemoglobin: 12.1 g/dL (ref 12.0–15.0)
Immature Granulocytes: 0 %
Lymphocytes Relative: 54 %
Lymphs Abs: 4.8 10*3/uL — ABNORMAL HIGH (ref 0.7–4.0)
MCH: 28.3 pg (ref 26.0–34.0)
MCHC: 33.6 g/dL (ref 30.0–36.0)
MCV: 84.1 fL (ref 80.0–100.0)
Monocytes Absolute: 0.5 10*3/uL (ref 0.1–1.0)
Monocytes Relative: 5 %
Neutro Abs: 3.4 10*3/uL (ref 1.7–7.7)
Neutrophils Relative %: 39 %
Platelets: 237 10*3/uL (ref 150–400)
RBC: 4.28 MIL/uL (ref 3.87–5.11)
RDW: 12.5 % (ref 11.5–15.5)
WBC: 8.8 10*3/uL (ref 4.0–10.5)
nRBC: 0 % (ref 0.0–0.2)

## 2019-01-31 NOTE — Progress Notes (Signed)
[facility] OFF[facility] PROGRESS NOTE  Patient Care Team: Leighton Ruff, MD as PCP - General (Family Medicine) Heath Lark, MD as Consulting Physician (Hematology and Oncology)  ASSESSMENT & PLAN:  CLL (chronic lymphocytic leukemia) ([place]) From the CBC standpoint, her total white count very good today Technically, she does not fulfill the criteria to call her lymphocytosis as CLL due to lymphocyte count less than 5 She has no signs or symptoms to suggest active disease She had no signs of anemia, thrombocytopenia or recent infection I plan to see her back in a year months for further follow-up  Abnormal mammogram of right breast She has abnormal mammogram detected on screening mammogram only She has no detectable or palpable lump She is scheduled for diagnostic imaging next week I will call her next week once results are available for further plan of care   No orders of the defined types were placed in this encounter.   INTERVAL HISTORY: Please see below for problem oriented charting. She returns for further follow-up She was found to have abnormal mammogram recently She has no detectable lump No new lymphadenopathy Appetite is stable No recent infection, fever or chills  SUMMARY OF ONCOLOGIC HISTORY: Oncology History  CLL (chronic lymphocytic leukemia) ([place])  02/24/2004 Pathology Results    Case #: [id number] confirmed CLL   01/23/2017 Pathology Results   Peripheral Blood Flow Cytometry - CD5-POSITIVE MONOCLONAL B CELL POPULATION MOST CONSISTENT WITH CHRONIC LYMPHOCYTIC LEUKEMIA - SEE COMMENT Diagnosis Comment: Examination of the peripheral blood reveals a lymphocytosis with frequent small lymphocytes that have clumped chromatin and scant cytoplasm . Larger cells with prominent nucleoli are not identified. By flow cytometry, a B-cell population that expresses CD5, CD19, CD20, CD21, CD22, HLA-DR and CD23 comprises 46% of all lymphocytes. Overall, the morphology  and immunophenotype are consistent with the patient's history of chronic lymphocytic leukemia   01/23/2017 Pathology Results   FISh confirmed deletion 13 q    01/23/2018 Cancer Staging   Staging form: Chronic Lymphocytic Leukemia / Small Lymphocytic Lymphoma, AJCC 8th Edition - Clinical: Modified Rai Stage 0 (Modified Rai risk: Low, Lymphocytosis: Present, Adenopathy: Absent, Organomegaly: Absent, Anemia: Absent, Thrombocytopenia: Absent) - Signed by Heath Lark, MD on 01/23/2018     REVIEW OF SYSTEMS:   Constitutional: Denies fevers, chills or abnormal weight loss Eyes: Denies blurriness of vision Ears, nose, mouth, throat, and face: Denies mucositis or sore throat Respiratory: Denies cough, dyspnea or wheezes Cardiovascular: Denies palpitation, chest discomfort or lower extremity swelling Gastrointestinal:  Denies nausea, heartburn or change in bowel habits Skin: Denies abnormal skin rashes Lymphatics: Denies new lymphadenopathy or easy bruising Neurological:Denies numbness, tingling or new weaknesses Behavioral/Psych: Mood is stable, no new changes  All other systems were reviewed with the patient and are negative.  I have reviewed the past medical history, past surgical history, social history and family history with the patient and they are unchanged from previous note.  ALLERGIES:  is allergic to contrast media [iodinated diagnostic agents]; provera [medroxyprogesterone acetate]; and sulfa antibiotics.  MEDICATIONS:  Current Outpatient Medications  Medication Sig Dispense Refill  . Acetaminophen (TYLENOL PO) Take by mouth.    . hydrochlorothiazide (HYDRODIURIL) 25 MG tablet Take 0.5 tablets by mouth daily.    Marland Kitchen ibuprofen (ADVIL) 200 MG tablet     . UNABLE TO FIND Vital tears    . UNABLE TO FIND Estradiol/vitamin e vaginal cream compounded. Insert .5 into vagina twice weekly     No current facility-administered medications for this visit.  PHYSICAL EXAMINATION: ECOG  PERFORMANCE STATUS: 0 - Asymptomatic  Vitals:   01/31/19 1224  BP: 130/69  Pulse: 88  Resp: 17  Temp: 98 F (36.7 C)  SpO2: 100%   Filed Weights   01/31/19 1224  Weight: 137 lb (62.1 kg)    GENERAL:alert, no distress and comfortable SKIN: skin color, texture, turgor are normal, no rashes or significant lesions EYES: normal, Conjunctiva are pink and non-injected, sclera clear OROPHARYNX:no exudate, no erythema and lips, buccal mucosa, and tongue normal  NECK: supple, thyroid normal size, non-tender, without nodularity LYMPH:  no palpable lymphadenopathy in the cervical, axillary or inguinal LUNGS: clear to auscultation and percussion with normal breathing effort HEART: regular rate & rhythm and no murmurs and no lower extremity edema ABDOMEN:abdomen soft, non-tender and normal bowel sounds Musculoskeletal:no cyanosis of digits and no clubbing  NEURO: alert & oriented x 3 with fluent speech, no focal motor/sensory deficits  LABORATORY DATA:  I have reviewed the data as listed    Component Value Date/Time   NA 141 05/04/2018 1249   NA 139 08/25/2014 1309   K 4.0 05/04/2018 1249   K 3.8 08/25/2014 1309   CL 106 05/04/2018 1249   CL 102 08/29/2012 0934   CO2 28 05/04/2018 1249   CO2 23 08/25/2014 1309   GLUCOSE 92 05/04/2018 1249   GLUCOSE 99 08/25/2014 1309   GLUCOSE 100 (H) 08/29/2012 0934   BUN 13 05/04/2018 1249   BUN 13.8 08/25/2014 1309   CREATININE 0.72 05/04/2018 1249   CREATININE 0.6 08/25/2014 1309   CALCIUM 9.8 05/04/2018 1249   CALCIUM 9.8 08/25/2014 1309   PROT 6.8 05/04/2018 1249   PROT 6.8 08/25/2014 1309   ALBUMIN 4.2 05/04/2018 1249   ALBUMIN 4.4 08/25/2014 1309   AST 19 05/04/2018 1249   AST 18 08/25/2014 1309   ALT 20 05/04/2018 1249   ALT 21 08/25/2014 1309   ALKPHOS 108 05/04/2018 1249   ALKPHOS 111 08/25/2014 1309   BILITOT 0.6 05/04/2018 1249   BILITOT 0.54 08/25/2014 1309   GFRNONAA >60 05/04/2018 1249   GFRAA >60 05/04/2018 1249     No results found for: SPEP, UPEP  Lab Results  Component Value Date   WBC 8.8 01/31/2019   NEUTROABS 3.4 01/31/2019   HGB 12.1 01/31/2019   HCT 36.0 01/31/2019   MCV 84.1 01/31/2019   PLT 237 01/31/2019      Chemistry      Component Value Date/Time   NA 141 05/04/2018 1249   NA 139 08/25/2014 1309   K 4.0 05/04/2018 1249   K 3.8 08/25/2014 1309   CL 106 05/04/2018 1249   CL 102 08/29/2012 0934   CO2 28 05/04/2018 1249   CO2 23 08/25/2014 1309   BUN 13 05/04/2018 1249   BUN 13.8 08/25/2014 1309   CREATININE 0.72 05/04/2018 1249   CREATININE 0.6 08/25/2014 1309      Component Value Date/Time   CALCIUM 9.8 05/04/2018 1249   CALCIUM 9.8 08/25/2014 1309   ALKPHOS 108 05/04/2018 1249   ALKPHOS 111 08/25/2014 1309   AST 19 05/04/2018 1249   AST 18 08/25/2014 1309   ALT 20 05/04/2018 1249   ALT 21 08/25/2014 1309   BILITOT 0.6 05/04/2018 1249   BILITOT 0.54 08/25/2014 1309       RADIOGRAPHIC STUDIES: I have personally reviewed the radiological images as listed and agreed with the findings in the report. Mm 3d Screen Breast Bilateral  Result Date: 01/29/2019 CLINICAL DATA:  Screening. EXAM: DIGITAL SCREENING BILATERAL MAMMOGRAM WITH TOMO AND CAD COMPARISON:  Previous exam(s). ACR Breast Density Category c: The breast tissue is heterogeneously dense, which may obscure small masses. FINDINGS: In the right breast, a possible asymmetry warrants further evaluation. In the left breast, no findings suspicious for malignancy. Images were processed with CAD. IMPRESSION: Further evaluation is suggested for possible asymmetry in the right breast. RECOMMENDATION: Diagnostic mammogram and possibly ultrasound of the right breast. (Code:[id number]) The patient will be contacted regarding the findings, and additional imaging will be scheduled. BI-RADS CATEGORY  0: Incomplete. Need additional imaging evaluation and/or prior mammograms for comparison. Electronically Signed   By: Everlean Alstrom M.D.   On: 01/29/2019 11:13    All questions were answered. The patient knows to call the clinic with any problems, questions or concerns. No barriers to learning was detected.  I spent 15 minutes counseling the patient face to face. The total time spent in the appointment was 20 minutes and more than 50% was on counseling and review of test results  Heath Lark, MD 01/31/2019 1:39 PM

## 2019-01-31 NOTE — Assessment & Plan Note (Signed)
From the CBC standpoint, her total white count very good today Technically, she does not fulfill the criteria to call her lymphocytosis as CLL due to lymphocyte count less than 5 She has no signs or symptoms to suggest active disease She had no signs of anemia, thrombocytopenia or recent infection I plan to see her back in a year months for further follow-up

## 2019-01-31 NOTE — Assessment & Plan Note (Signed)
She has abnormal mammogram detected on screening mammogram only She has no detectable or palpable lump She is scheduled for diagnostic imaging next week I will call her next week once results are available for further plan of care

## 2019-02-04 ENCOUNTER — Ambulatory Visit
Admission: RE | Admit: 2019-02-04 | Discharge: 2019-02-04 | Disposition: A | Discharge status: Home / Self Care | Payer: PPO | Source: Ambulatory Visit | Attending: Certified Nurse Midwife | Admitting: Certified Nurse Midwife

## 2019-02-04 ENCOUNTER — Ambulatory Visit: Payer: PPO

## 2019-02-04 ENCOUNTER — Other Ambulatory Visit: Payer: Self-pay

## 2019-02-04 DIAGNOSIS — R922 Inconclusive mammogram: Secondary | ICD-10-CM | POA: Diagnosis not present

## 2019-02-04 DIAGNOSIS — R928 Other abnormal and inconclusive findings on diagnostic imaging of breast: Secondary | ICD-10-CM

## 2019-02-05 ENCOUNTER — Telehealth: Payer: Self-pay

## 2019-02-05 NOTE — Telephone Encounter (Signed)
Called and obtained vm with msg that identified pt.  RN lvm with below msg from Dr Alvy Bimler.

## 2019-02-05 NOTE — Telephone Encounter (Signed)
-----   Message from Heath Lark, MD sent at 02/05/2019 10:21 AM EDT ----- Regarding: mammogram ok Looks like her mammogram from yesterday is OK Please call her nothing further to be added

## 2019-02-14 DIAGNOSIS — H04123 Dry eye syndrome of bilateral lacrimal glands: Secondary | ICD-10-CM | POA: Diagnosis not present

## 2019-02-14 DIAGNOSIS — Z961 Presence of intraocular lens: Secondary | ICD-10-CM | POA: Diagnosis not present

## 2019-02-14 DIAGNOSIS — H2640 Unspecified secondary cataract: Secondary | ICD-10-CM | POA: Diagnosis not present

## 2019-02-14 DIAGNOSIS — H1859 Other hereditary corneal dystrophies: Secondary | ICD-10-CM | POA: Diagnosis not present

## 2019-03-27 ENCOUNTER — Other Ambulatory Visit: Payer: Self-pay

## 2019-03-27 ENCOUNTER — Ambulatory Visit
Admission: RE | Admit: 2019-03-27 | Discharge: 2019-03-27 | Disposition: A | Discharge status: Home / Self Care | Payer: PPO | Source: Ambulatory Visit | Attending: Family Medicine | Admitting: Family Medicine

## 2019-03-27 DIAGNOSIS — E2839 Other primary ovarian failure: Secondary | ICD-10-CM

## 2019-03-27 DIAGNOSIS — Z780 Asymptomatic menopausal state: Secondary | ICD-10-CM | POA: Diagnosis not present

## 2019-03-27 DIAGNOSIS — M8588 Other specified disorders of bone density and structure, other site: Secondary | ICD-10-CM | POA: Diagnosis not present

## 2019-03-27 DIAGNOSIS — M810 Age-related osteoporosis without current pathological fracture: Secondary | ICD-10-CM | POA: Diagnosis not present

## 2019-04-16 DIAGNOSIS — J302 Other seasonal allergic rhinitis: Secondary | ICD-10-CM | POA: Diagnosis not present

## 2019-04-16 DIAGNOSIS — I10 Essential (primary) hypertension: Secondary | ICD-10-CM | POA: Diagnosis not present

## 2019-04-16 DIAGNOSIS — E559 Vitamin D deficiency, unspecified: Secondary | ICD-10-CM | POA: Diagnosis not present

## 2019-04-16 DIAGNOSIS — E7800 Pure hypercholesterolemia, unspecified: Secondary | ICD-10-CM | POA: Diagnosis not present

## 2019-04-16 DIAGNOSIS — C9110 Chronic lymphocytic leukemia of B-cell type not having achieved remission: Secondary | ICD-10-CM | POA: Diagnosis not present

## 2019-05-21 DIAGNOSIS — H3554 Dystrophies primarily involving the retinal pigment epithelium: Secondary | ICD-10-CM | POA: Diagnosis not present

## 2019-05-21 DIAGNOSIS — H18593 Other hereditary corneal dystrophies, bilateral: Secondary | ICD-10-CM | POA: Diagnosis not present

## 2019-05-21 DIAGNOSIS — Z961 Presence of intraocular lens: Secondary | ICD-10-CM | POA: Diagnosis not present

## 2019-05-21 DIAGNOSIS — H35033 Hypertensive retinopathy, bilateral: Secondary | ICD-10-CM | POA: Diagnosis not present

## 2019-05-21 DIAGNOSIS — H04123 Dry eye syndrome of bilateral lacrimal glands: Secondary | ICD-10-CM | POA: Diagnosis not present

## 2019-05-28 DIAGNOSIS — H26492 Other secondary cataract, left eye: Secondary | ICD-10-CM | POA: Diagnosis not present

## 2019-06-11 DIAGNOSIS — H26491 Other secondary cataract, right eye: Secondary | ICD-10-CM | POA: Diagnosis not present

## 2019-06-14 DIAGNOSIS — H6982 Other specified disorders of Eustachian tube, left ear: Secondary | ICD-10-CM | POA: Diagnosis not present

## 2019-06-14 DIAGNOSIS — J310 Chronic rhinitis: Secondary | ICD-10-CM | POA: Diagnosis not present

## 2019-06-14 DIAGNOSIS — J343 Hypertrophy of nasal turbinates: Secondary | ICD-10-CM | POA: Diagnosis not present

## 2019-06-14 DIAGNOSIS — J342 Deviated nasal septum: Secondary | ICD-10-CM | POA: Diagnosis not present

## 2019-06-21 ENCOUNTER — Other Ambulatory Visit: Payer: Self-pay | Admitting: Certified Nurse Midwife

## 2019-06-21 NOTE — Telephone Encounter (Signed)
Medication refill request: estradiol Vaginal Tab  71mcg  Last AEX:  01/21/19 Next AEX: 01/24/20 Last MMG (if hormonal medication request):02/04/19 bi-rads 1 neg  Refill authorized: #[id number] with 8 Rf pended for today

## 2019-07-15 DIAGNOSIS — E7800 Pure hypercholesterolemia, unspecified: Secondary | ICD-10-CM | POA: Diagnosis not present

## 2019-07-16 ENCOUNTER — Other Ambulatory Visit: Payer: Self-pay | Admitting: Certified Nurse Midwife

## 2019-07-19 DIAGNOSIS — E559 Vitamin D deficiency, unspecified: Secondary | ICD-10-CM | POA: Diagnosis not present

## 2019-07-19 DIAGNOSIS — I10 Essential (primary) hypertension: Secondary | ICD-10-CM | POA: Diagnosis not present

## 2019-07-19 DIAGNOSIS — E7800 Pure hypercholesterolemia, unspecified: Secondary | ICD-10-CM | POA: Diagnosis not present

## 2019-07-19 DIAGNOSIS — Z889 Allergy status to unspecified drugs, medicaments and biological substances status: Secondary | ICD-10-CM | POA: Diagnosis not present

## 2019-08-16 DIAGNOSIS — Z711 Person with feared health complaint in whom no diagnosis is made: Secondary | ICD-10-CM | POA: Diagnosis not present

## 2019-08-20 DIAGNOSIS — H02874 Vascular anomalies of left upper eyelid: Secondary | ICD-10-CM | POA: Diagnosis not present

## 2019-08-20 DIAGNOSIS — H18599 Other hereditary corneal dystrophies, unspecified eye: Secondary | ICD-10-CM | POA: Diagnosis not present

## 2019-08-20 DIAGNOSIS — Z961 Presence of intraocular lens: Secondary | ICD-10-CM | POA: Diagnosis not present

## 2019-08-20 DIAGNOSIS — Z79899 Other long term (current) drug therapy: Secondary | ICD-10-CM | POA: Diagnosis not present

## 2019-08-20 DIAGNOSIS — H18593 Other hereditary corneal dystrophies, bilateral: Secondary | ICD-10-CM | POA: Diagnosis not present

## 2019-09-23 DIAGNOSIS — H18599 Other hereditary corneal dystrophies, unspecified eye: Secondary | ICD-10-CM | POA: Diagnosis not present

## 2019-10-03 ENCOUNTER — Ambulatory Visit: Payer: PPO | Attending: Internal Medicine

## 2019-10-03 DIAGNOSIS — Z23 Encounter for immunization: Secondary | ICD-10-CM

## 2019-10-03 NOTE — Progress Notes (Signed)
   Covid-19 Vaccination Clinic  Name:  Audrey Sweeney    MRN: [id number] DOB: 09-21-1945  10/03/2019  Ms. Brother was observed post Covid-19 immunization for 15 minutes without incident. She was provided with Vaccine Information Sheet and instruction to access the V-Safe system.   [name] was instructed to call 911 with any severe reactions post vaccine: Marland Kitchen Difficulty breathing  . Swelling of face and throat  . A fast heartbeat  . A bad rash all over body  . Dizziness and weakness

## 2019-10-03 NOTE — Progress Notes (Addendum)
At 0910 Pt reported symptoms of possible reaction to nurse from observation chair. Nurse notified that pt reported feeling "nervous, light headed, left arm feels weak, tingly and feeling continues down into my feet". Pt sitting in chair. BP with [facility] 140/64, P78.  0912 Charge RN notified, EMS called over. O2 sat 97-98%. BP reataken by EMS. O2 sat 98%. RR 21. Pulse 78. Pt states she was so anxious about vaccine she did not sleep last night. After speaking to paramedic, pt calmer. Pt given crackers and water and sat in Obs 10 more mins with Charge RN before going home.

## 2019-10-08 DIAGNOSIS — D1801 Hemangioma of skin and subcutaneous tissue: Secondary | ICD-10-CM | POA: Diagnosis not present

## 2019-10-08 DIAGNOSIS — H04123 Dry eye syndrome of bilateral lacrimal glands: Secondary | ICD-10-CM | POA: Diagnosis not present

## 2019-10-08 DIAGNOSIS — H1045 Other chronic allergic conjunctivitis: Secondary | ICD-10-CM | POA: Diagnosis not present

## 2019-10-08 DIAGNOSIS — H18593 Other hereditary corneal dystrophies, bilateral: Secondary | ICD-10-CM | POA: Diagnosis not present

## 2019-10-22 ENCOUNTER — Encounter: Payer: Self-pay | Admitting: Certified Nurse Midwife

## 2019-10-29 ENCOUNTER — Ambulatory Visit: Payer: PPO

## 2019-11-05 ENCOUNTER — Ambulatory Visit: Payer: PPO | Attending: Internal Medicine

## 2019-11-05 DIAGNOSIS — Z23 Encounter for immunization: Secondary | ICD-10-CM

## 2019-11-13 DIAGNOSIS — H5030 Unspecified intermittent heterotropia: Secondary | ICD-10-CM | POA: Diagnosis not present

## 2019-11-13 DIAGNOSIS — H518 Other specified disorders of binocular movement: Secondary | ICD-10-CM | POA: Diagnosis not present

## 2019-11-14 DIAGNOSIS — Z862 Personal history of diseases of the blood and blood-forming organs and certain disorders involving the immune mechanism: Secondary | ICD-10-CM | POA: Diagnosis not present

## 2019-11-14 DIAGNOSIS — H518 Other specified disorders of binocular movement: Secondary | ICD-10-CM | POA: Diagnosis not present

## 2019-11-27 DIAGNOSIS — H518 Other specified disorders of binocular movement: Secondary | ICD-10-CM | POA: Diagnosis not present

## 2019-12-01 DIAGNOSIS — L255 Unspecified contact dermatitis due to plants, except food: Secondary | ICD-10-CM | POA: Diagnosis not present

## 2019-12-23 ENCOUNTER — Other Ambulatory Visit: Payer: Self-pay | Admitting: Family Medicine

## 2019-12-23 DIAGNOSIS — Z1231 Encounter for screening mammogram for malignant neoplasm of breast: Secondary | ICD-10-CM

## 2019-12-23 DIAGNOSIS — H18523 Epithelial (juvenile) corneal dystrophy, bilateral: Secondary | ICD-10-CM | POA: Diagnosis not present

## 2019-12-23 DIAGNOSIS — H5030 Unspecified intermittent heterotropia: Secondary | ICD-10-CM | POA: Diagnosis not present

## 2019-12-23 DIAGNOSIS — Z961 Presence of intraocular lens: Secondary | ICD-10-CM | POA: Diagnosis not present

## 2020-01-24 ENCOUNTER — Ambulatory Visit: Payer: PPO | Admitting: Certified Nurse Midwife

## 2020-01-29 ENCOUNTER — Other Ambulatory Visit: Payer: Self-pay

## 2020-01-29 ENCOUNTER — Ambulatory Visit
Admission: RE | Admit: 2020-01-29 | Discharge: 2020-01-29 | Disposition: A | Discharge status: Home / Self Care | Payer: PPO | Source: Ambulatory Visit | Attending: Family Medicine | Admitting: Family Medicine

## 2020-01-29 DIAGNOSIS — Z1231 Encounter for screening mammogram for malignant neoplasm of breast: Secondary | ICD-10-CM | POA: Diagnosis not present

## 2020-01-30 ENCOUNTER — Encounter: Payer: Self-pay | Admitting: Obstetrics and Gynecology

## 2020-01-30 ENCOUNTER — Ambulatory Visit (INDEPENDENT_AMBULATORY_CARE_PROVIDER_SITE_OTHER): Payer: PPO | Admitting: Obstetrics and Gynecology

## 2020-01-30 ENCOUNTER — Other Ambulatory Visit: Payer: Self-pay | Admitting: Hematology and Oncology

## 2020-01-30 VITALS — BP 122/66 | HR 92 | Temp 98.9°F | Ht 65.0 in | Wt 140.2 lb

## 2020-01-30 DIAGNOSIS — N941 Unspecified dyspareunia: Secondary | ICD-10-CM

## 2020-01-30 DIAGNOSIS — N952 Postmenopausal atrophic vaginitis: Secondary | ICD-10-CM

## 2020-01-30 DIAGNOSIS — Z8739 Personal history of other diseases of the musculoskeletal system and connective tissue: Secondary | ICD-10-CM

## 2020-01-30 DIAGNOSIS — C9110 Chronic lymphocytic leukemia of B-cell type not having achieved remission: Secondary | ICD-10-CM

## 2020-01-30 DIAGNOSIS — Z01419 Encounter for gynecological examination (general) (routine) without abnormal findings: Secondary | ICD-10-CM

## 2020-01-30 MED ORDER — ESTRADIOL 10 MCG VA TABS: ORAL_TABLET | VAGINAL | 3 refills | Status: DC

## 2020-01-30 NOTE — Progress Notes (Signed)
73 y.o. G3P3 Married White or Caucasian Not Hispanic or Latino female here for annual exam. She is having vaginal dryness with intercourse. She is using lubrication.  No vaginal bleeding. No bowel or bladder changes. Mild mixed incontinence, stable. 8 oz of coffee a day at max.       H/O CLL. Last year was told she was in remission.   Patient's last menstrual period was 08/01/2006.          Sexually active: Yes.    The current method of family planning is post menopausal status.    Exercising: Yes.    walking daily Smoker:  no  Health Maintenance: Pap:01/22/19 WNL HPV Neg   12-01-16 neg  History of abnormal Pap:  no MMG:  01/29/20 not yet resulted BMD:   03/27/19 osteoporotic, T score -3.2 (she will f/u with her primary) Colonoscopy: 2017 neg f/u 37yrs  TDaP:  2018    reports that she has never smoked. She has never used smokeless tobacco. She reports that she does not drink alcohol and does not use drugs.  Past Medical History:  Diagnosis Date  . Abnormal Pap smear of cervix    ?  Marland Kitchen Arthritis   . C. difficile colitis   . CLL (chronic lymphocytic leukemia) ([facility])    Rai stage 0.  . Depression   . Hypertension   . IBS (irritable bowel syndrome)   . Osteopenia   . Rheumatoid arthritis(714.0)   She doesn't think she has RA  Past Surgical History:  Procedure Laterality Date  . CESAREAN SECTION    . COLONOSCOPY W/ POLYPECTOMY     Pre-cancerous polyps removed.  . ENDOMETRIAL BIOPSY     8/98  . TUBAL LIGATION      Current Outpatient Medications  Medication Sig Dispense Refill  . Acetaminophen (TYLENOL PO) Take by mouth.    Lianne Moris [employer]ION 60-120 MG 12 hr tablet Take 1 tablet by mouth daily.    . Estradiol 10 MCG TABS vaginal tablet INSERT 1 TABLET VAGINALLY TWICE A WEEK. 8 tablet 7  . hydrochlorothiazide (HYDRODIURIL) 25 MG tablet Take 0.5 tablets by mouth daily.    Marland Kitchen ibuprofen (ADVIL) 200 MG tablet     . sodium chloride (MURO 128) 5 % ophthalmic ointment  Place into both eyes at bedtime.     No current facility-administered medications for this visit.    Family History  Problem Relation Age of Onset  . Colon cancer Mother   . Stroke Father   . Diabetes Father   . Heart disease Father   . Hypertension Father   . Heart disease Maternal Grandmother   . Diabetes Maternal Grandmother   . Cancer Paternal Grandmother        female cancer  . Emphysema Maternal Grandfather   . Breast cancer Cousin        passed away age 30    Review of Systems  All other systems reviewed and are negative.   Exam:   BP 122/66   Pulse 92   Temp 98.9 F (37.2 C)   Ht 5\' 5"  (1.651 m)   Wt 140 lb 3.2 oz (63.6 kg)   LMP 08/01/2006   SpO2 99%   BMI 23.33 kg/m   Weight change: @WEIGHTCHANGE @ Height:   Height: 5\' 5"  (165.1 cm)  Ht Readings from Last 3 Encounters:  01/30/20 5\' 5"  (1.651 m)  01/31/19 5\' 5"  (1.651 m)  01/22/19 5\' 5"  (1.651 m)    General appearance:  alert, cooperative and appears stated age Head: Normocephalic, without obvious abnormality, atraumatic Neck: no adenopathy, supple, symmetrical, trachea midline and thyroid normal to inspection and palpation Lungs: clear to auscultation bilaterally Cardiovascular: regular rate and rhythm Breasts: normal appearance, no masses or tenderness Abdomen: soft, non-tender; non distended,  no masses,  no organomegaly Extremities: extremities normal, atraumatic, no cyanosis or edema Skin: Skin color, texture, turgor normal. No rashes or lesions Lymph nodes: Cervical, supraclavicular, and axillary nodes normal. No abnormal inguinal nodes palpated Neurologic: Grossly normal   Pelvic: External genitalia:  no lesions              Urethra:  normal appearing urethra with no masses, tenderness or lesions              Bartholins and Skenes: normal                 Vagina: atrophic appearing vagina with normal color and discharge, no lesions              Cervix: no lesions               Bimanual Exam:   Uterus:  normal size, contour, position, consistency, mobility, non-tender              Adnexa: no mass, fullness, tenderness               Rectovaginal: Confirms               Anus:  normal sphincter tone, no lesions  Terence Lux chaperoned for the exam.  A:  Well Woman with normal exam  Vaginal atrophy  Entry dyspareunia  Osteoporosis  P:   No pap this year  Mammogram just done  Colonoscopy next year  F/U with her primary for management of her osteoporosis.  Discussed breast self exam  Discussed calcium and vit D intake  Continue vaginal estrogen  Use lubrication with intercourse, she should control rate and depth of penetration  Consider vaginal dilators

## 2020-01-30 NOTE — Patient Instructions (Addendum)
Look up Vaginal Dilators on line.   Osteoporosis  Osteoporosis is thinning and loss of density in your bones. Osteoporosis makes bones more brittle and fragile and more likely to break (fracture). Over time, osteoporosis can cause your bones to become so weak that they fracture after a minor fall. Bones in the hip, wrist, and spine are most likely to fracture due to osteoporosis. What are the causes? The exact cause of this condition is not known. What increases the risk? You may be at greater risk for osteoporosis if you:  Have a family history of the condition.  Have poor nutrition.  Use steroid medicines, such as prednisone.  Are female.  Are age 44 or older.  Smoke or have a history of smoking.  Are not physically active (are sedentary).  Are white (Caucasian) or of Asian descent.  Have a small body frame.  Take certain medicines, such as antiseizure medicines. What are the signs or symptoms? A fracture might be the first sign of osteoporosis, especially if the fracture results from a fall or injury that usually would not cause a bone to break. Other signs and symptoms include:  Pain in the neck or low back.  Stooped posture.  Loss of height. How is this diagnosed? This condition may be diagnosed based on:  Your medical history.  A physical exam.  A bone mineral density test, also called a DXA or DEXA test (dual-energy X-ray absorptiometry test). This test uses X-rays to measure the amount of minerals in your bones. How is this treated? The goal of treatment is to strengthen your bones and lower your risk for a fracture. Treatment may involve:  Making lifestyle changes, such as: ? Including foods with more calcium and vitamin D in your diet. ? Doing weight-bearing and muscle-strengthening exercises. ? Stopping tobacco use. ? Limiting alcohol intake.  Taking medicine to slow the process of bone loss or to increase bone density.  Taking daily supplements of  calcium and vitamin D.  Taking hormone replacement medicines, such as estrogen for women and testosterone for men.  Monitoring your levels of calcium and vitamin D. Follow these instructions at home:  Activity  Exercise as told by your health care provider. Ask your health care provider what exercises and activities are safe for you. You should do: ? Exercises that make you work against gravity (weight-bearing exercises), such as tai chi, yoga, or walking. ? Exercises to strengthen muscles, such as lifting weights. Lifestyle  Limit alcohol intake to no more than 1 drink a day for nonpregnant women and 2 drinks a day for men. One drink equals 12 oz of beer, 5 oz of wine, or 1 oz of hard liquor.  Do not use any products that contain nicotine or tobacco, such as cigarettes and e-cigarettes. If you need help quitting, ask your health care provider. Preventing falls  Use devices to help you move around (mobility aids) as needed, such as canes, walkers, scooters, or crutches.  Keep rooms well-lit and clutter-free.  Remove tripping hazards from walkways, including cords and throw rugs.  Install grab bars in bathrooms and safety rails on stairs.  Use rubber mats in the bathroom and other areas that are often wet or slippery.  Wear closed-toe shoes that fit well and support your feet. Wear shoes that have rubber soles or low heels.  Review your medicines with your health care provider. Some medicines can cause dizziness or changes in blood pressure, which can increase your risk of falling. General  instructions  Include calcium and vitamin D in your diet. Calcium is important for bone health, and vitamin D helps your body to absorb calcium. Good sources of calcium and vitamin D include: ? Certain fatty fish, such as salmon and tuna. ? Products that have calcium and vitamin D added to them (fortified products), such as fortified cereals. ? Egg yolks. ? Cheese. ? Liver.  Take  over-the-counter and prescription medicines only as told by your health care provider.  Keep all follow-up visits as told by your health care provider. This is important. Contact a health care provider if:  You have never been screened for osteoporosis and you are: ? A woman who is age 28 or older. ? A man who is age 52 or older. Get help right away if:  You fall or injure yourself. Summary  Osteoporosis is thinning and loss of density in your bones. This makes bones more brittle and fragile and more likely to break (fracture),even with minor falls.  The goal of treatment is to strengthen your bones and reduce your risk for a fracture.  Include calcium and vitamin D in your diet. Calcium is important for bone health, and vitamin D helps your body to absorb calcium.  Talk with your health care provider about screening for osteoporosis if you are a woman who is age 28 or older, or a man who is age 59 or older. This information is not intended to replace advice given to you by your health care provider. Make sure you discuss any questions you have with your health care provider. Document Revised: 06/30/2017 Document Reviewed: 05/12/2017 Elsevier Patient Education  [place] DIET:  We recommended that you start or continue a regular exercise program for good health. Regular exercise means any activity that makes your heart beat faster and makes you sweat.  We recommend exercising at least 30 minutes per day at least 3 days a week, preferably 4 or 5.  We also recommend a diet low in fat and sugar.  Inactivity, poor dietary choices and obesity can cause diabetes, heart attack, stroke, and kidney damage, among others.    ALCOHOL AND SMOKING:  Women should limit their alcohol intake to no more than 7 drinks/beers/glasses of wine (combined, not each!) per week. Moderation of alcohol intake to this level decreases your risk of breast cancer and liver damage. And of course,  no recreational drugs are part of a healthy lifestyle.  And absolutely no smoking or even second hand smoke. Most people know smoking can cause heart and lung diseases, but did you know it also contributes to weakening of your bones? Aging of your skin?  Yellowing of your teeth and nails?  CALCIUM AND VITAMIN D:  Adequate intake of calcium and Vitamin D are recommended.  The recommendations for exact amounts of these supplements seem to change often, but generally speaking 1,200 mg of calcium (between diet and supplement) and 800 units of Vitamin D per day seems prudent. Certain women may benefit from higher intake of Vitamin D.  If you are among these women, your doctor will have told you during your visit.    PAP SMEARS:  Pap smears, to check for cervical cancer or precancers,  have traditionally been done yearly, although recent scientific advances have shown that most women can have pap smears less often.  However, every woman still should have a physical exam from her gynecologist every year. It will include a breast check, inspection of  the vulva and vagina to check for abnormal growths or skin changes, a visual exam of the cervix, and then an exam to evaluate the size and shape of the uterus and ovaries.  And after 74 years of age, a rectal exam is indicated to check for rectal cancers. We will also provide age appropriate advice regarding health maintenance, like when you should have certain vaccines, screening for sexually transmitted diseases, bone density testing, colonoscopy, mammograms, etc.   MAMMOGRAMS:  All women over 37 years old should have a yearly mammogram. Many facilities now offer a "3D" mammogram, which may cost around $50 extra out of pocket. If possible,  we recommend you accept the option to have the 3D mammogram performed.  It both reduces the number of women who will be called back for extra views which then turn out to be normal, and it is better than the routine mammogram at  detecting truly abnormal areas.    COLON CANCER SCREENING: Now recommend starting at age 34. At this time colonoscopy is not covered for routine screening until 50. There are take home tests that can be done between 45-49.   COLONOSCOPY:  Colonoscopy to screen for colon cancer is recommended for all women at age 72.  We know, you hate the idea of the prep.  We agree, BUT, having colon cancer and not knowing it is worse!!  Colon cancer so often starts as a polyp that can be seen and removed at colonscopy, which can quite literally save your life!  And if your first colonoscopy is normal and you have no family history of colon cancer, most women don't have to have it again for 10 years.  Once every ten years, you can do something that may end up saving your life, right?  We will be happy to help you get it scheduled when you are ready.  Be sure to check your insurance coverage so you understand how much it will cost.  It may be covered as a preventative service at no cost, but you should check your particular policy.      Breast Self-Awareness Breast self-awareness means being familiar with how your breasts look and feel. It involves checking your breasts regularly and reporting any changes to your health care provider. Practicing breast self-awareness is important. A change in your breasts can be a sign of a serious medical problem. Being familiar with how your breasts look and feel allows you to find any problems early, when treatment is more likely to be successful. All women should practice breast self-awareness, including women who have had breast implants. How to do a breast self-exam One way to learn what is normal for your breasts and whether your breasts are changing is to do a breast self-exam. To do a breast self-exam: Look for Changes  1. Remove all the clothing above your waist. 2. Stand in front of a mirror in a room with good lighting. 3. Put your hands on your hips. 4. Push your hands  firmly downward. 5. Compare your breasts in the mirror. Look for differences between them (asymmetry), such as: ? Differences in shape. ? Differences in size. ? Puckers, dips, and bumps in one breast and not the other. 6. Look at each breast for changes in your skin, such as: ? Redness. ? Scaly areas. 7. Look for changes in your nipples, such as: ? Discharge. ? Bleeding. ? Dimpling. ? Redness. ? A change in position. Feel for Changes Carefully feel your breasts for  lumps and changes. It is best to do this while lying on your back on the floor and again while sitting or standing in the shower or tub with soapy water on your skin. Feel each breast in the following way:  Place the arm on the side of the breast you are examining above your head.  Feel your breast with the other hand.  Start in the nipple area and make  inch (2 cm) overlapping circles to feel your breast. Use the pads of your three middle fingers to do this. Apply light pressure, then medium pressure, then firm pressure. The light pressure will allow you to feel the tissue closest to the skin. The medium pressure will allow you to feel the tissue that is a little deeper. The firm pressure will allow you to feel the tissue close to the ribs.  Continue the overlapping circles, moving downward over the breast until you feel your ribs below your breast.  Move one finger-width toward the center of the body. Continue to use the  inch (2 cm) overlapping circles to feel your breast as you move slowly up toward your collarbone.  Continue the up and down exam using all three pressures until you reach your armpit.  Write Down What You Find  Write down what is normal for each breast and any changes that you find. Keep a written record with breast changes or normal findings for each breast. By writing this information down, you do not need to depend only on memory for size, tenderness, or location. Write down where you are in your  menstrual cycle, if you are still menstruating. If you are having trouble noticing differences in your breasts, do not get discouraged. With time you will become more familiar with the variations in your breasts and more comfortable with the exam. How often should I examine my breasts? Examine your breasts every month. If you are breastfeeding, the best time to examine your breasts is after a feeding or after using a breast pump. If you menstruate, the best time to examine your breasts is 5-7 days after your period is over. During your period, your breasts are lumpier, and it may be more difficult to notice changes. When should I see my health care provider? See your health care provider if you notice:  A change in shape or size of your breasts or nipples.  A change in the skin of your breast or nipples, such as a reddened or scaly area.  Unusual discharge from your nipples.  A lump or thick area that was not there before.  Pain in your breasts.  Anything that concerns you.

## 2020-01-31 ENCOUNTER — Encounter: Payer: Self-pay | Admitting: Hematology and Oncology

## 2020-01-31 ENCOUNTER — Inpatient Hospital Stay: Payer: PPO | Attending: Hematology and Oncology | Admitting: Hematology and Oncology

## 2020-01-31 ENCOUNTER — Telehealth: Payer: Self-pay | Admitting: Hematology and Oncology

## 2020-01-31 ENCOUNTER — Other Ambulatory Visit: Payer: Self-pay

## 2020-01-31 ENCOUNTER — Inpatient Hospital Stay: Payer: PPO

## 2020-01-31 DIAGNOSIS — Z79899 Other long term (current) drug therapy: Secondary | ICD-10-CM | POA: Insufficient documentation

## 2020-01-31 DIAGNOSIS — L237 Allergic contact dermatitis due to plants, except food: Secondary | ICD-10-CM | POA: Diagnosis not present

## 2020-01-31 DIAGNOSIS — Z882 Allergy status to sulfonamides status: Secondary | ICD-10-CM | POA: Insufficient documentation

## 2020-01-31 DIAGNOSIS — C9110 Chronic lymphocytic leukemia of B-cell type not having achieved remission: Secondary | ICD-10-CM | POA: Diagnosis not present

## 2020-01-31 LAB — CBC WITH DIFFERENTIAL/PLATELET
Abs Immature Granulocytes: 0.02 10*3/uL (ref 0.00–0.07)
Basophils Absolute: 0.1 10*3/uL (ref 0.0–0.1)
Basophils Relative: 1 %
Eosinophils Absolute: 0.2 10*3/uL (ref 0.0–0.5)
Eosinophils Relative: 2 %
HCT: 37 % (ref 36.0–46.0)
Hemoglobin: 12.5 g/dL (ref 12.0–15.0)
Immature Granulocytes: 0 %
Lymphocytes Relative: 40 %
Lymphs Abs: 2.8 10*3/uL (ref 0.7–4.0)
MCH: 28.8 pg (ref 26.0–34.0)
MCHC: 33.8 g/dL (ref 30.0–36.0)
MCV: 85.3 fL (ref 80.0–100.0)
Monocytes Absolute: 0.5 10*3/uL (ref 0.1–1.0)
Monocytes Relative: 7 %
Neutro Abs: 3.4 10*3/uL (ref 1.7–7.7)
Neutrophils Relative %: 50 %
Platelets: 242 10*3/uL (ref 150–400)
RBC: 4.34 MIL/uL (ref 3.87–5.11)
RDW: 12.2 % (ref 11.5–15.5)
WBC: 7 10*3/uL (ref 4.0–10.5)
nRBC: 0 % (ref 0.0–0.2)

## 2020-01-31 NOTE — Assessment & Plan Note (Signed)
She has no detectable abnormal lymphocytosis, likely due to recent prednisone therapy I will see her back in a year for further follow-up

## 2020-01-31 NOTE — Telephone Encounter (Signed)
Scheduled appts per 7/2 los. Pt declined print out of AVS and stated she would refer to mychart.

## 2020-01-31 NOTE — Progress Notes (Signed)
[facility] OFFICE PROGRESS NOTE  Patient Care Team: Leighton Ruff, MD as PCP - General (Family Medicine) Heath Lark, MD as Consulting Physician (Hematology and Oncology)  ASSESSMENT & PLAN:  CLL (chronic lymphocytic leukemia) ([place]) She has no detectable abnormal lymphocytosis, likely due to recent prednisone therapy I will see her back in a year for further follow-up    No orders of the defined types were placed in this encounter.   All questions were answered. The patient knows to call the clinic with any problems, questions or concerns. The total time spent in the appointment was 10 minutes encounter with patients including review of chart and various tests results, discussions about plan of care and coordination of care plan   Heath Lark, MD 01/31/2020 9:12 AM  INTERVAL HISTORY: Please see below for problem oriented charting. She returns for further follow-up She received a dose of prednisone pack around end of April from [place] She denies recent infection, fever or chills No new lymphadenopathy.  SUMMARY OF ONCOLOGIC HISTORY: Oncology History  CLL (chronic lymphocytic leukemia) ([place])  02/24/2004 Pathology Results    Case #: [id number] confirmed CLL   01/23/2017 Pathology Results   Peripheral Blood Flow Cytometry - CD5-POSITIVE MONOCLONAL B CELL POPULATION MOST CONSISTENT WITH CHRONIC LYMPHOCYTIC LEUKEMIA - SEE COMMENT Diagnosis Comment: Examination of the peripheral blood reveals a lymphocytosis with frequent small lymphocytes that have clumped chromatin and scant cytoplasm . Larger cells with prominent nucleoli are not identified. By flow cytometry, a B-cell population that expresses CD5, CD19, CD20, CD21, CD22, HLA-DR and CD23 comprises 46% of all lymphocytes. Overall, the morphology and immunophenotype are consistent with the patient's history of chronic lymphocytic leukemia   01/23/2017 Pathology Results   FISh confirmed deletion 13 q     01/23/2018 Cancer Staging   Staging form: Chronic Lymphocytic Leukemia / Small Lymphocytic Lymphoma, AJCC 8th Edition - Clinical: Modified Rai Stage 0 (Modified Rai risk: Low, Lymphocytosis: Present, Adenopathy: Absent, Organomegaly: Absent, Anemia: Absent, Thrombocytopenia: Absent) - Signed by Heath Lark, MD on 01/23/2018     REVIEW OF SYSTEMS:   Constitutional: Denies fevers, chills or abnormal weight loss Eyes: Denies blurriness of vision Ears, nose, mouth, throat, and face: Denies mucositis or sore throat Respiratory: Denies cough, dyspnea or wheezes Cardiovascular: Denies palpitation, chest discomfort or lower extremity swelling Gastrointestinal:  Denies nausea, heartburn or change in bowel habits Skin: Denies abnormal skin rashes Lymphatics: Denies new lymphadenopathy or easy bruising Neurological:Denies numbness, tingling or new weaknesses Behavioral/Psych: Mood is stable, no new changes  All other systems were reviewed with the patient and are negative.  I have reviewed the past medical history, past surgical history, social history and family history with the patient and they are unchanged from previous note.  ALLERGIES:  is allergic to contrast media [iodinated diagnostic agents], provera [medroxyprogesterone acetate], and sulfa antibiotics.  MEDICATIONS:  Current Outpatient Medications  Medication Sig Dispense Refill  . cholecalciferol (VITAMIN D3) 25 MCG (1000 UNIT) tablet Take 2,000 Units by mouth daily.    . Acetaminophen (TYLENOL PO) Take by mouth.    Lianne Moris [facility] 60-120 MG 12 hr tablet Take 1 tablet by mouth daily.    . Estradiol 10 MCG TABS vaginal tablet INSERT 1 TABLET VAGINALLY TWICE A WEEK. 24 tablet 3  . hydrochlorothiazide (HYDRODIURIL) 25 MG tablet Take 0.5 tablets by mouth daily.    Marland Kitchen ibuprofen (ADVIL) 200 MG tablet     . sodium chloride (MURO 128) 5 % ophthalmic ointment  Place into both eyes at bedtime.     No current  facility-administered medications for this visit.    PHYSICAL EXAMINATION: ECOG PERFORMANCE STATUS: 0 - Asymptomatic  Vitals:   01/31/20 0906  BP: (!) 120/44  Pulse: 84  Resp: 18  Temp: 97.6 F (36.4 C)  SpO2: 100%   Filed Weights   01/31/20 0906  Weight: 139 lb 9.6 oz (63.3 kg)    GENERAL:alert, no distress and comfortable NEURO: alert & oriented x 3 with fluent speech, no focal motor/sensory deficits  LABORATORY DATA:  I have reviewed the data as listed    Component Value Date/Time   NA 141 05/04/2018 1249   NA 139 08/25/2014 1309   K 4.0 05/04/2018 1249   K 3.8 08/25/2014 1309   CL 106 05/04/2018 1249   CL 102 08/29/2012 0934   CO2 28 05/04/2018 1249   CO2 23 08/25/2014 1309   GLUCOSE 92 05/04/2018 1249   GLUCOSE 99 08/25/2014 1309   GLUCOSE 100 (H) 08/29/2012 0934   BUN 13 05/04/2018 1249   BUN 13.8 08/25/2014 1309   CREATININE 0.72 05/04/2018 1249   CREATININE 0.6 08/25/2014 1309   CALCIUM 9.8 05/04/2018 1249   CALCIUM 9.8 08/25/2014 1309   PROT 6.8 05/04/2018 1249   PROT 6.8 08/25/2014 1309   ALBUMIN 4.2 05/04/2018 1249   ALBUMIN 4.4 08/25/2014 1309   AST 19 05/04/2018 1249   AST 18 08/25/2014 1309   ALT 20 05/04/2018 1249   ALT 21 08/25/2014 1309   ALKPHOS 108 05/04/2018 1249   ALKPHOS 111 08/25/2014 1309   BILITOT 0.6 05/04/2018 1249   BILITOT 0.54 08/25/2014 1309   GFRNONAA >60 05/04/2018 1249   GFRAA >60 05/04/2018 1249    No results found for: SPEP, UPEP  Lab Results  Component Value Date   WBC 7.0 01/31/2020   NEUTROABS 3.4 01/31/2020   HGB 12.5 01/31/2020   HCT 37.0 01/31/2020   MCV 85.3 01/31/2020   PLT 242 01/31/2020      Chemistry      Component Value Date/Time   NA 141 05/04/2018 1249   NA 139 08/25/2014 1309   K 4.0 05/04/2018 1249   K 3.8 08/25/2014 1309   CL 106 05/04/2018 1249   CL 102 08/29/2012 0934   CO2 28 05/04/2018 1249   CO2 23 08/25/2014 1309   BUN 13 05/04/2018 1249   BUN 13.8 08/25/2014 1309    CREATININE 0.72 05/04/2018 1249   CREATININE 0.6 08/25/2014 1309      Component Value Date/Time   CALCIUM 9.8 05/04/2018 1249   CALCIUM 9.8 08/25/2014 1309   ALKPHOS 108 05/04/2018 1249   ALKPHOS 111 08/25/2014 1309   AST 19 05/04/2018 1249   AST 18 08/25/2014 1309   ALT 20 05/04/2018 1249   ALT 21 08/25/2014 1309   BILITOT 0.6 05/04/2018 1249   BILITOT 0.54 08/25/2014 1309       RADIOGRAPHIC STUDIES: I have personally reviewed the radiological images as listed and agreed with the findings in the report. MM 3D SCREEN BREAST BILATERAL  Result Date: 01/30/2020 CLINICAL DATA:  Screening. EXAM: DIGITAL SCREENING BILATERAL MAMMOGRAM WITH TOMO AND CAD COMPARISON:  Previous exam(s). ACR Breast Density Category b: There are scattered areas of fibroglandular density. FINDINGS: There are no findings suspicious for malignancy. Images were processed with CAD. IMPRESSION: No mammographic evidence of malignancy. A result letter of this screening mammogram will be mailed directly to the patient. RECOMMENDATION: Screening mammogram in one year. (Code:SM-B-01Y) BI-RADS  CATEGORY  1: Negative. Electronically Signed   By: Ammie Ferrier M.D.   On: 01/30/2020 14:21

## 2020-02-24 DIAGNOSIS — B349 Viral infection, unspecified: Secondary | ICD-10-CM | POA: Diagnosis not present

## 2020-02-24 DIAGNOSIS — J209 Acute bronchitis, unspecified: Secondary | ICD-10-CM | POA: Diagnosis not present

## 2020-03-17 DIAGNOSIS — R9389 Abnormal findings on diagnostic imaging of other specified body structures: Secondary | ICD-10-CM | POA: Diagnosis not present

## 2020-03-17 DIAGNOSIS — M810 Age-related osteoporosis without current pathological fracture: Secondary | ICD-10-CM | POA: Diagnosis not present

## 2020-03-17 DIAGNOSIS — H532 Diplopia: Secondary | ICD-10-CM | POA: Diagnosis not present

## 2020-04-09 DIAGNOSIS — H35033 Hypertensive retinopathy, bilateral: Secondary | ICD-10-CM | POA: Diagnosis not present

## 2020-04-09 DIAGNOSIS — H18593 Other hereditary corneal dystrophies, bilateral: Secondary | ICD-10-CM | POA: Diagnosis not present

## 2020-04-09 DIAGNOSIS — H04123 Dry eye syndrome of bilateral lacrimal glands: Secondary | ICD-10-CM | POA: Diagnosis not present

## 2020-05-05 DIAGNOSIS — C9110 Chronic lymphocytic leukemia of B-cell type not having achieved remission: Secondary | ICD-10-CM | POA: Diagnosis not present

## 2020-05-05 DIAGNOSIS — Z889 Allergy status to unspecified drugs, medicaments and biological substances status: Secondary | ICD-10-CM | POA: Diagnosis not present

## 2020-05-05 DIAGNOSIS — E7800 Pure hypercholesterolemia, unspecified: Secondary | ICD-10-CM | POA: Diagnosis not present

## 2020-05-05 DIAGNOSIS — E559 Vitamin D deficiency, unspecified: Secondary | ICD-10-CM | POA: Diagnosis not present

## 2020-05-05 DIAGNOSIS — I10 Essential (primary) hypertension: Secondary | ICD-10-CM | POA: Diagnosis not present

## 2020-05-25 ENCOUNTER — Other Ambulatory Visit: Payer: Self-pay

## 2020-05-25 ENCOUNTER — Encounter: Payer: Self-pay | Admitting: Neurology

## 2020-05-25 ENCOUNTER — Ambulatory Visit: Payer: PPO | Admitting: Neurology

## 2020-05-25 VITALS — BP 123/72 | HR 74 | Ht 65.5 in | Wt 141.0 lb

## 2020-05-25 DIAGNOSIS — H532 Diplopia: Secondary | ICD-10-CM | POA: Diagnosis not present

## 2020-05-25 DIAGNOSIS — R9082 White matter disease, unspecified: Secondary | ICD-10-CM

## 2020-05-25 NOTE — Patient Instructions (Signed)
Repatha or Praluent for cholesterol, discuss with Dr. Drema Dallas Recommend LDL< [place] 81mg  a day   Stroke Prevention Some medical conditions and lifestyle choices can lead to a higher risk for a stroke. You can help to prevent a stroke by making nutrition, lifestyle, and other changes. What nutrition changes can be made?   Eat healthy foods. ? Choose foods that are high in fiber. These include:  Fresh fruits.  Fresh vegetables.  Whole grains. ? Eat at least 5 or more servings of fruits and vegetables each day. Try to fill half of your plate at each meal with fruits and vegetables. ? Choose lean protein foods. These include:  Lowfat (lean) cuts of meat.  Chicken without skin.  Fish.  Tofu.  Beans.  Nuts. ? Eat low-fat dairy products. ? Avoid foods that:  Are high in salt (sodium).  Have saturated fat.  Have trans fat.  Have cholesterol.  Are processed.  Are premade.  Follow eating guidelines as told by your doctor. These may include: ? Reducing how many calories you eat and drink each day. ? Limiting how much salt you eat or drink each day to 1,500 milligrams (mg). ? Using only healthy fats for cooking. These include:  Olive oil.  Canola oil.  Sunflower oil. ? Counting how many carbohydrates you eat and drink each day. What lifestyle changes can be made?  Try to stay at a healthy weight. Talk to your doctor about what a good weight is for you.  Get at least 30 minutes of moderate physical activity at least 5 days a week. This can include: ? Fast walking. ? Biking. ? Swimming.  Do not use any products that have nicotine or tobacco. This includes cigarettes and e-cigarettes. If you need help quitting, ask your doctor. Avoid being around tobacco smoke in general.  Limit how much alcohol you drink to no more than 1 drink a day for nonpregnant women and 2 drinks a day for men. One drink equals 12 oz of beer, 5 oz of wine, or 1 oz of hard  liquor.  Do not use drugs.  Avoid taking birth control pills. Talk to your doctor about the risks of taking birth control pills if: ? You are over 41 years old. ? You smoke. ? You get migraines. ? You have had a blood clot. What other changes can be made?  Manage your cholesterol. ? It is important to eat a healthy diet. ? If your cholesterol cannot be managed through your diet, you may also need to take medicines. Take medicines as told by your doctor.  Manage your diabetes. ? It is important to eat a healthy diet and to exercise regularly. ? If your blood sugar cannot be managed through diet and exercise, you may need to take medicines. Take medicines as told by your doctor.  Control your high blood pressure (hypertension). ? Try to keep your blood pressure below 130/80. This can help lower your risk of stroke. ? It is important to eat a healthy diet and to exercise regularly. ? If your blood pressure cannot be managed through diet and exercise, you may need to take medicines. Take medicines as told by your doctor. ? Ask your doctor if you should check your blood pressure at home. ? Have your blood pressure checked every year. Do this even if your blood pressure is normal.  Talk to your doctor about getting checked for a sleep disorder. Signs of this can include: ? Snoring a  lot. ? Feeling very tired.  Take over-the-counter and prescription medicines only as told by your doctor. These may include aspirin or blood thinners (antiplatelets or anticoagulants).  Make sure that any other medical conditions you have are managed. Where to find more information  American Stroke Association: www.strokeassociation.org  National Stroke Association: www.stroke.org Get help right away if:  You have any symptoms of stroke. "BE FAST" is an easy way to remember the main warning signs: ? B - Balance. Signs are dizziness, sudden trouble walking, or loss of balance. ? E - Eyes. Signs are  trouble seeing or a sudden change in how you see. ? F - Face. Signs are sudden weakness or loss of feeling of the face, or the face or eyelid drooping on one side. ? A - Arms. Signs are weakness or loss of feeling in an arm. This happens suddenly and usually on one side of the body. ? S - Speech. Signs are sudden trouble speaking, slurred speech, or trouble understanding what people say. ? T - Time. Time to call emergency services. Write down what time symptoms started.  You have other signs of stroke, such as: ? A sudden, very bad headache with no known cause. ? Feeling sick to your stomach (nausea). ? Throwing up (vomiting). ? Jerky movements you cannot control (seizure). These symptoms may represent a serious problem that is an emergency. Do not wait to see if the symptoms will go away. Get medical help right away. Call your local emergency services (911 in the [place] Do not drive yourself to the hospital. Summary  You can prevent a stroke by eating healthy, exercising, not smoking, drinking less alcohol, and treating other health problems, such as diabetes, high blood pressure, or high cholesterol.  Do not use any products that contain nicotine or tobacco, such as cigarettes and e-cigarettes.  Get help right away if you have any signs or symptoms of a stroke. This information is not intended to replace advice given to you by your health care provider. Make sure you discuss any questions you have with your health care provider. Document Revised: 09/13/2018 Document Reviewed: 10/19/2016 Elsevier Patient Education  [place].

## 2020-05-25 NOTE — Progress Notes (Signed)
[facility]    Provider:  Dr Jaynee Eagles Requesting Provider: Leighton Ruff, MD Primary Care Provider:  Leighton Ruff, MD  CC:  New onset double vision  HPI:  Audrey Sweeney is a 74 y.o. female here as requested by Leighton Ruff, MD for double vision. PMHx hypercholesterolemia, rheumatoid arthritis, osteoporosis, CLL not having achieved remission, depression, hypertension, vitamin D deficiency, statin intolerance, cervical disc disease, abnormal MRI, ankylosing spondylitis, congenitally blocked tear duct, fatty liver, C. Difficile.  I reviewed Dr. Drema Dallas notes, patient had recent onset of double vision in her lateral visual field, she had some white matter changes on MRI of the brain wondering if that in any way has anything to do with it.  She was having some double vision in her lateral vision, her doctor ordered an MRI which showed "scattered white matter lesions most commonly represent chronic small vessel ischemic disease".  Patient has no strokelike symptoms, she does have some dizziness but it seems related to her sinuses, her eye doctor gave her glasses with prisms which have helped, she has not had any symptoms from the cervical disc area, her father has a history of strokes.  When she looks to left she sees double images, side by side, started several years ago, worsening over the last 2 years, it is consistent, if she closes one eye the double vision goes away. The glasses with prisms help. No ptosis, no other double vision, no facial weakness, she had cataract surgery and it got worse after that. completely with the prisms. Blood work was negative for myasthenia gravis.Her symptoms are constant. We spent an extended period of time revieing her MRI images, discussed the white matter changes are normal in aging and hers were age appropriate, I did not see anything in her MRI brain to cause diplopia. No other focal neurologic deficits, associated symptoms, inciting  events or modifiable factors.  Reviewed notes, labs and imaging from outside physicians, which showed:  I reviewed notes from ophthalmology, diagnosed with esotropia and divergence insufficiency otherwise examination was unremarkable.  Prisms helped.   Carotid dopplers 01/02/2018 reviewed report: Minor carotid atherosclerosis. No hemodynamically significant ICA stenosis. Degree of narrowing less than 50% bilaterally by ultrasound criteria.  TSH 2.029 11/14/2019  MRI brain report reviewed: Also reviewed images and agree with findings, normal for age(patient brought a disk with her)  1. No evidence of abnormality in the orbits or extraocular muscles.  2. No evidence of acute intracranial abnormality.  3. Scattered white matter lesions are nonspecific but most commonly represent chronic small vessel ischemic disease in this age group. The differential also includes the sequela of prior inflammation/demyelination or chronic ischemia associated with other vasculopathies.  4. Posterior disc protrusion at C4-C5 results in mild-to-moderate spinal canal stenosis.   Review of Systems: Patient complains of symptoms per HPI as well as the following symptoms: diplopia. Pertinent negatives and positives per HPI. All others negative.   Social History   Socioeconomic History  . Marital status: Married    Spouse name: Not on file  . Number of children: 4  . Years of education: Not on file  . Highest education level: High school graduate  Occupational History  . Not on file  Tobacco Use  . Smoking status: Never Smoker  . Smokeless tobacco: Never Used  Vaping Use  . Vaping Use: Never used  Substance and Sexual Activity  . Alcohol use: Never  . Drug use: Never  . Sexual activity: Yes    Partners: Male  Birth control/protection: Surgical    Comment: btl  Other Topics Concern  . Not on file  Social History Narrative   Lives at home with husband   Caffeine: 1 cup coffee in AM   Social  Determinants of Health   Financial Resource Strain:   . Difficulty of Paying Living Expenses: Not on file  Food Insecurity:   . Worried About Charity fundraiser in the Last Year: Not on file  . Ran Out of Food in the Last Year: Not on file  Transportation Needs:   . Lack of Transportation (Medical): Not on file  . Lack of Transportation (Non-Medical): Not on file  Physical Activity:   . Days of Exercise per Week: Not on file  . Minutes of Exercise per Session: Not on file  Stress:   . Feeling of Stress : Not on file  Social Connections:   . Frequency of Communication with Friends and Family: Not on file  . Frequency of Social Gatherings with Friends and Family: Not on file  . Attends Religious Services: Not on file  . Active Member of Clubs or Organizations: Not on file  . Attends Archivist Meetings: Not on file  . Marital Status: Not on file  Intimate Partner Violence:   . Fear of Current or Ex-Partner: Not on file  . Emotionally Abused: Not on file  . Physically Abused: Not on file  . Sexually Abused: Not on file    Family History  Problem Relation Age of Onset  . Colon cancer Mother   . Stroke Father   . Diabetes Father   . Heart disease Father   . Hypertension Father   . Heart disease Maternal Grandmother   . Diabetes Maternal Grandmother   . Cancer Paternal Grandmother        female cancer  . Emphysema Maternal Grandfather   . Breast cancer Cousin        passed away age 94  . Obesity Sister   . Diabetes Sister        gestational   . Obesity Brother   . Diabetes Paternal Grandfather   . Seizures Daughter        73 y.o. happened twice this year, diagnosed with Epilepsy thought to be from hormonal imbalance  . Obesity Brother   . Obesity Sister     Past Medical History:  Diagnosis Date  . Abnormal MRI   . Abnormal Pap smear of cervix    ?  Marland Kitchen Ankylosing spondylitis ([place])   . Arthritis   . C. difficile colitis   . Cervical disc disease   .  CLL (chronic lymphocytic leukemia) (HCC)    [place] stage 0.  . Colon polyps   . Depression   . Hypercholesterolemia   . Hypertension   . IBS (irritable bowel syndrome)   . Osteopenia   . Osteoporosis   . Rheumatoid arthritis(714.0)   . Seasonal allergies   . Statin intolerance     Patient Active Problem List   Diagnosis Date Noted  . Intermittent esotropia 11/13/2019  . Abnormal mammogram of right breast 01/31/2019  . Chronic sinusitis, unspecified 01/23/2018  . Acquired hypogammaglobulinemia ([place]) 01/23/2018  . Diverticulosis 03/10/2017  . Family history of colon cancer 03/10/2017  . History of adenomatous polyp of colon 03/10/2017  . RA (rheumatoid arthritis) ([place]) 03/10/2017  . Fatty infiltration of liver 01/23/2017  . CLL (chronic lymphocytic leukemia) ([place]) 11/13/2006    Past Surgical History:  Procedure Laterality Date  .  CESAREAN SECTION    . COLONOSCOPY W/ POLYPECTOMY     Pre-cancerous polyps removed.  . ENDOMETRIAL BIOPSY     8/98  . TUBAL LIGATION      Current Outpatient Medications  Medication Sig Dispense Refill  . Acetaminophen (TYLENOL PO) Take by mouth.    . cholecalciferol (VITAMIN D3) 25 MCG (1000 UNIT) tablet Take 2,000 Units by mouth daily.    . Estradiol 10 MCG TABS vaginal tablet INSERT 1 TABLET VAGINALLY TWICE A WEEK. 24 tablet 3  . fluticasone (FLONASE) 50 MCG/ACT nasal spray Place into both nostrils as needed for allergies or rhinitis.    . hydrochlorothiazide (HYDRODIURIL) 25 MG tablet Take 0.5 tablets by mouth daily.    Marland Kitchen ibuprofen (ADVIL) 200 MG tablet     . Polyethyl Glycol-Propyl Glycol (SYSTANE PRESERVATIVE FREE OP) Apply to eye.    . Probiotic Product (PROBIOTIC PO) Take by mouth.     No current facility-administered medications for this visit.    Allergies as of 05/25/2020 - Review Complete 01/31/2020  Allergen Reaction Noted  . Contrast media [iodinated diagnostic agents] Shortness Of Breath 08/22/2011  . Amoxicillin  05/25/2020  .  Influenza virus vac live quad Hives 05/25/2020  . Lipitor [atorvastatin]  05/25/2020  . Other  05/25/2020  . Provera [medroxyprogesterone acetate]  11/09/2012  . Sulfa antibiotics  10/28/2014  . Tetanus toxoids  05/25/2020    Vitals: BP 123/72 (BP Location: Left Arm, Patient Position: Sitting)   Pulse 74   Ht 5' 5.5" (1.664 m)   Wt 141 lb (64 kg)   LMP 08/01/2006   BMI 23.11 kg/m  Last Weight:  Wt Readings from Last 1 Encounters:  05/25/20 141 lb (64 kg)   Last Height:   Ht Readings from Last 1 Encounters:  05/25/20 5' 5.5" (1.664 m)     Physical exam: Exam: Gen: NAD, conversant, well nourised, well groomed                     CV: RRR, no MRG. No Carotid Bruits. No peripheral edema, warm, nontender Eyes: Conjunctivae clear without exudates or hemorrhage  Neuro: Detailed Neurologic Exam  Speech:    Speech is normal; fluent and spontaneous with normal comprehension.  Cognition:    The patient is oriented to person, place, and time;     recent and remote memory intact;     language fluent;     normal attention, concentration,     fund of knowledge Cranial Nerves:    The pupils are equal, round, and reactive to light. Pupils too small to visualize fundi. Visual fields are full to finger confrontation. Extraocular movements are intact. Trigeminal sensation is intact and the muscles of mastication are normal. The face is symmetric. The palate elevates in the midline. Hearing intact. Voice is normal. Shoulder shrug is normal. The tongue has normal motion without fasciculations.   Coordination:    No dysmetria or ataxia  Gait:    Normal native gait  Motor Observation:    No asymmetry, no atrophy, and no involuntary movements noted. Tone:    Normal muscle tone.    Posture:    Posture is normal. normal erect    Strength:    Strength is V/V in the upper and lower limbs.      Sensation: intact to LT     Reflex Exam:  DTR's:    Deep tendon reflexes in the upper  and lower extremities are normal bilaterally.   Toes:  The toes are downgoing bilaterally.   Clonus:    Clonus is absent.    Assessment/Plan:  74 y.o. female here as requested by Leighton Ruff, MD for double vision. PMHx hypercholesterolemia, rheumatoid arthritis, osteoporosis, CLL not having achieved remission, depression, hypertension, vitamin D deficiency, statin intolerance, cervical disc disease, abnormal MRI, ankylosing spondylitis, congenitally blocked tear duct, fatty liver, C. Difficile. Diplopia on horizontal left end gaze, resolved with prisms in glasses.   We spent an extended period of time reviewing her MRI images, discussed the white matter changes are normal in aging and hers were age appropriate, I did not see anything in her MRI brain to cause diplopia, brain stem/pons/mid brain appeared normal. She was already tested for thyroid eye disease as well as myasthenia gravis. Monitor clinically. If symptoms worsen or new symptoms appear, return to clinic.  I do recommend ASA 81mg  for stroke prevention and maintain strict control of hypertension with blood pressure goal below 130/90, diabetes with hemoglobin A1c goal below 6.5% and lipids with LDL cholesterol goal below 100 mg/dL.Patient has h/o statin intolerance hence recommend new PCSK9 inhibitor like Praluent or Repatha. Recommend she see her primary MD to discuss. I also advised the patient to eat a healthy diet with plenty of whole grains, cereals, fruits and vegetables, exercise regularly and maintain ideal body weight. Followup in the future with me if necessary.   Cc: Leighton Ruff, MD  Sarina Ill, MD  [facility] [address] [place], [place] [address]  Phone [phone] Fax [phone]  I spent over 60 minutes of face-to-face and non-face-to-face time with patient on the  1. Diplopia   2. White matter disease, unspecified    diagnosis.  This included previsit chart  review, lab review, study review, order entry, electronic health record documentation, patient education on the different diagnostic and therapeutic options, counseling and coordination of care, risks and benefits of management, compliance, or risk factor reduction

## 2020-06-28 IMAGING — MG DIGITAL SCREENING BILATERAL MAMMOGRAM WITH TOMO AND CAD
8 series · 8 of 24 positions shown · non-contrast
Comparison: Previous exam(s).

CLINICAL DATA: Screening.

EXAM:
DIGITAL SCREENING BILATERAL MAMMOGRAM WITH TOMO AND CAD

[R CC synth-2D]
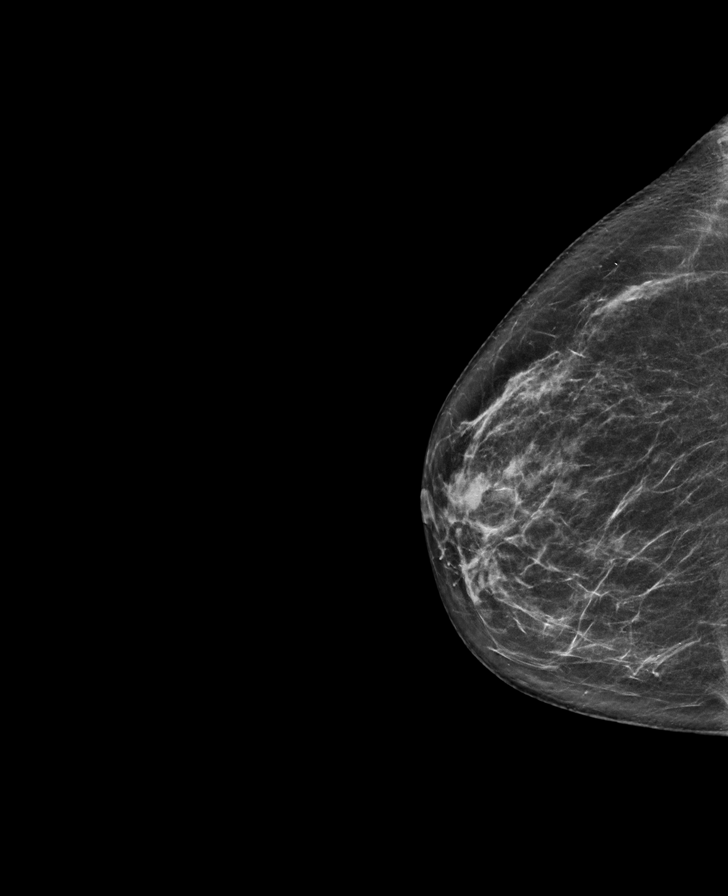

[R MLO synth-2D]
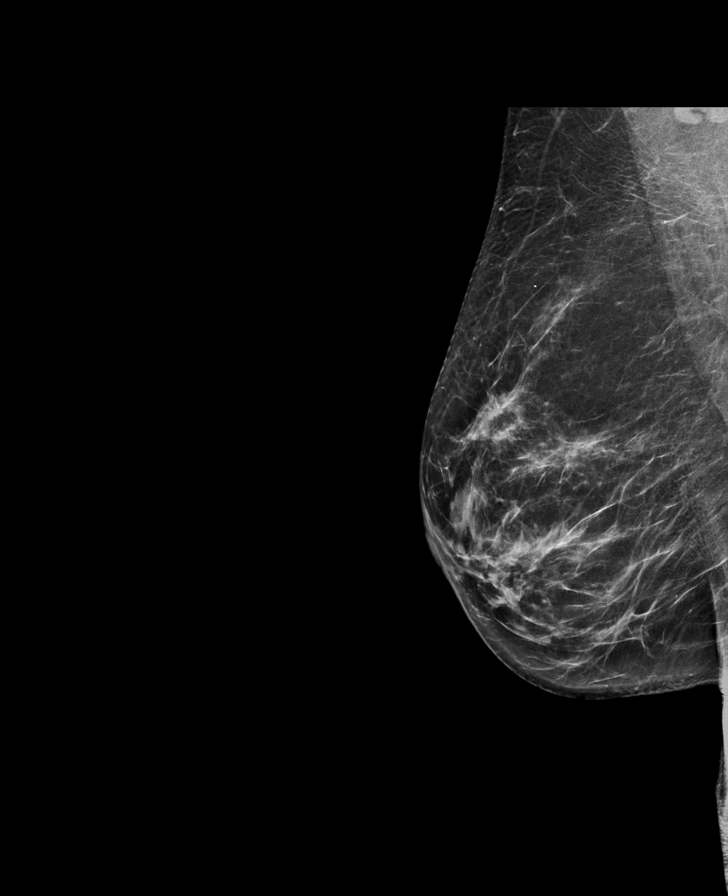

[L CC synth-2D]
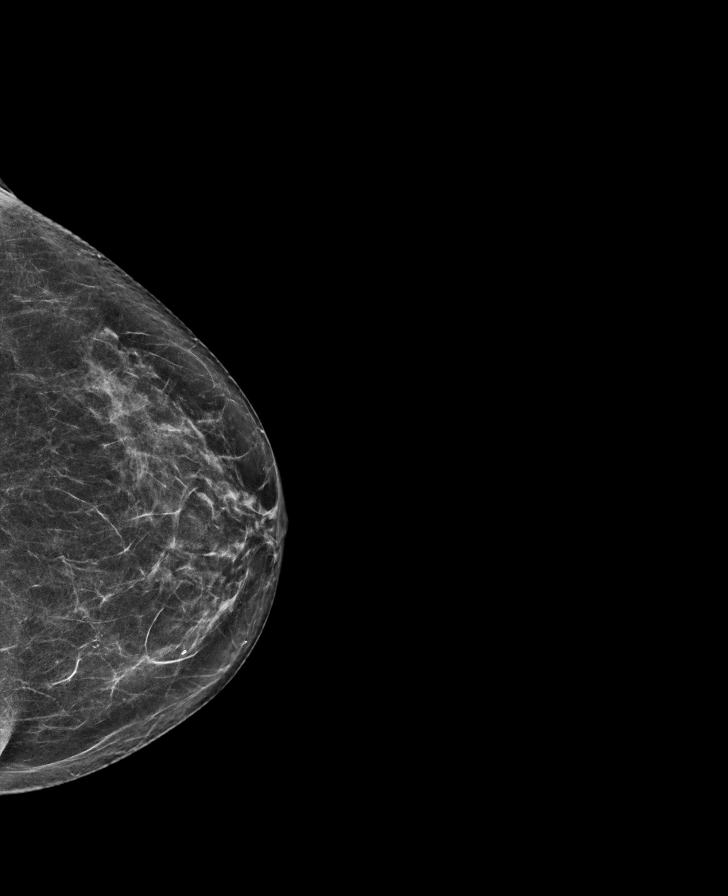

[L MLO synth-2D]
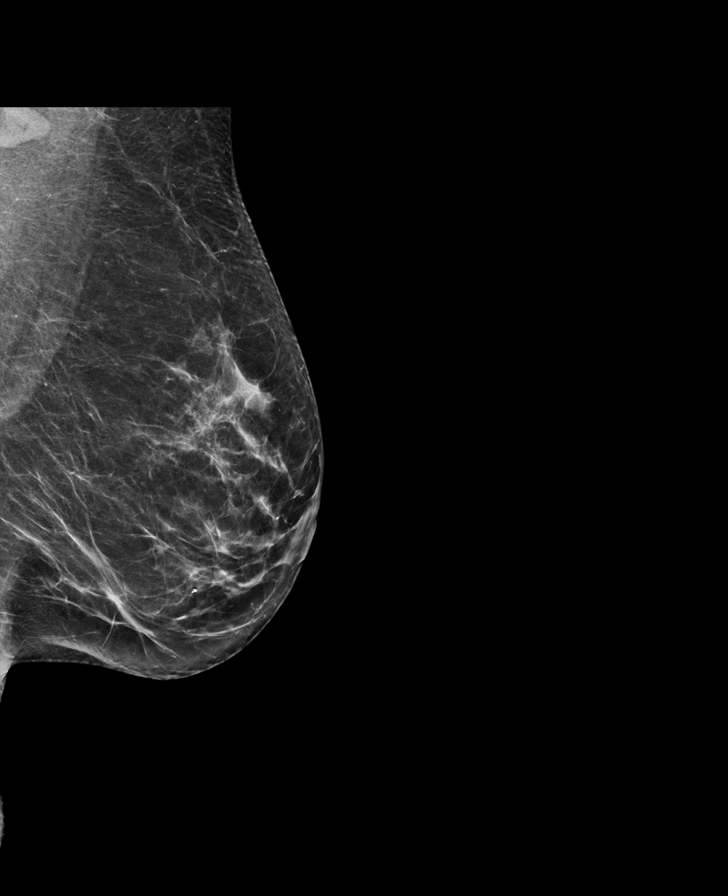

[R MLO tomo · tomo slice 35/70.0]
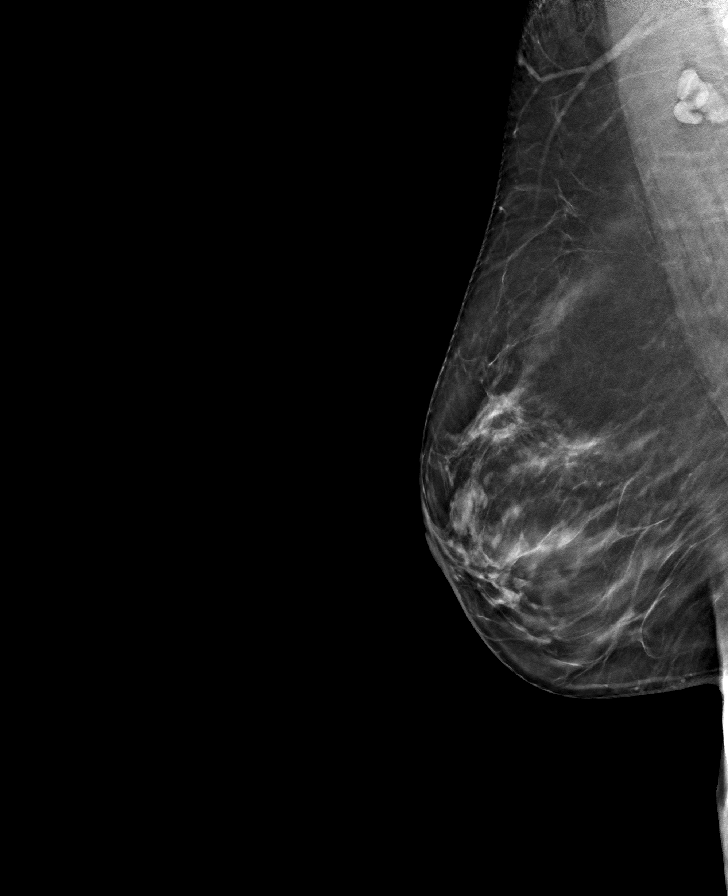

[R CC tomo · tomo slice 34/67.0]
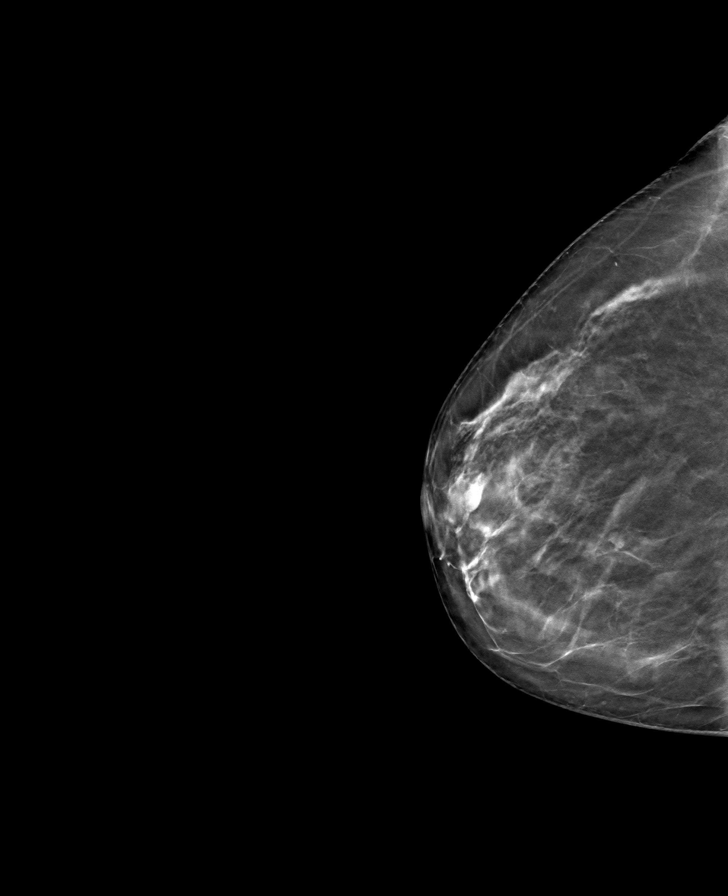

[L MLO tomo · tomo slice 36/71.0]
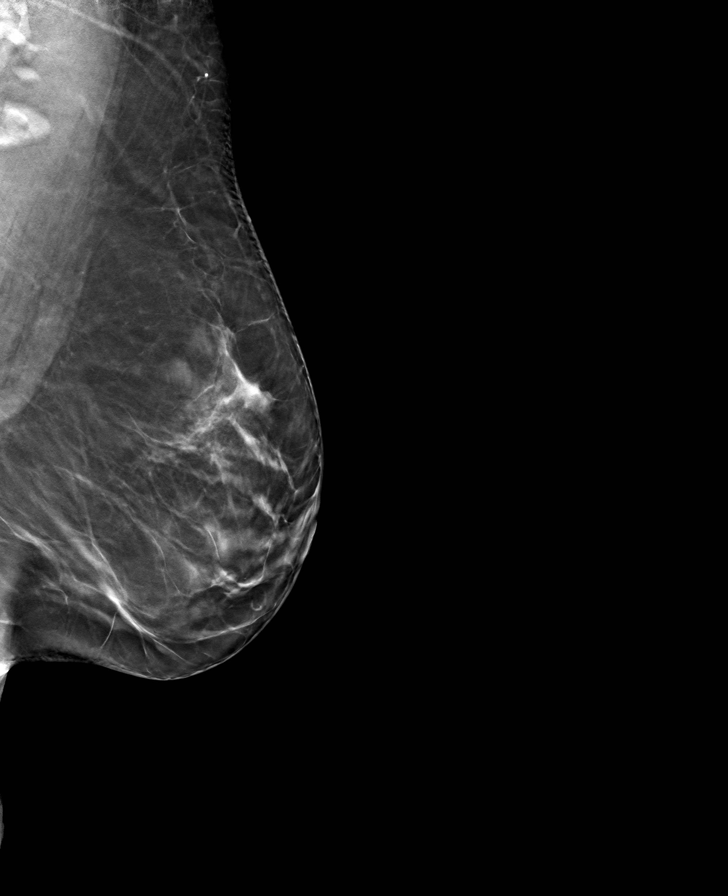

[L CC tomo · tomo slice 33/66.0]
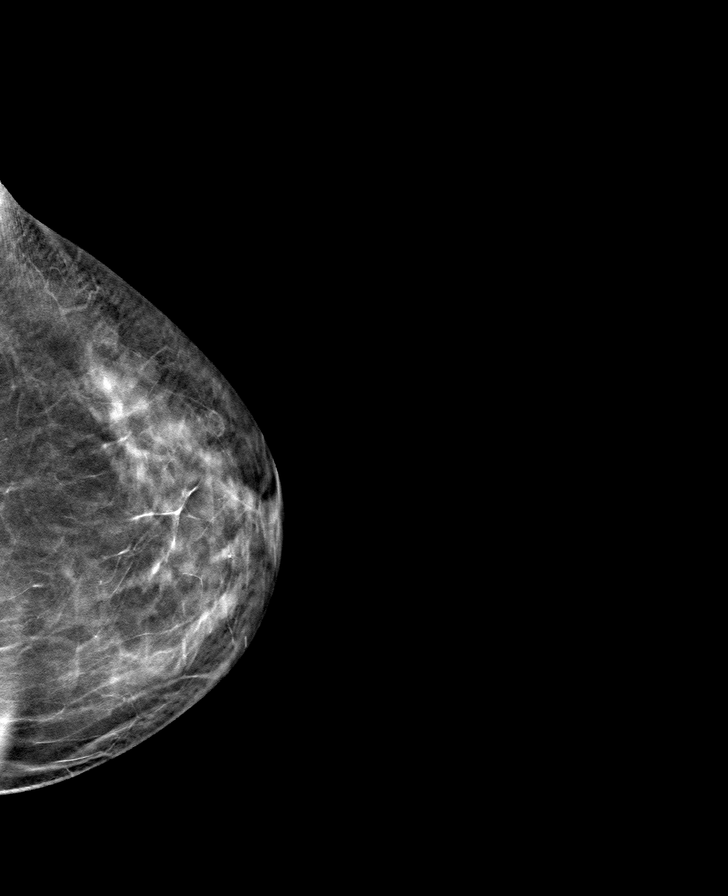

[8 of 24 positions shown; findings below may reference images not displayed]

ACR Breast Density Category c: The breast tissue is heterogeneously
dense, which may obscure small masses.
FINDINGS: In the right breast, a possible asymmetry warrants further
evaluation. In the left breast, no findings suspicious for
malignancy. Images were processed with CAD.
IMPRESSION: Further evaluation is suggested for possible asymmetry in the right
breast.

RECOMMENDATION:
Diagnostic mammogram and possibly ultrasound of the right breast.
(Code:EU-2-NNK)

The patient will be contacted regarding the findings, and additional
imaging will be scheduled.

BI-RADS CATEGORY  0: Incomplete. Need additional imaging evaluation
and/or prior mammograms for comparison.

## 2020-07-01 DIAGNOSIS — H18523 Epithelial (juvenile) corneal dystrophy, bilateral: Secondary | ICD-10-CM | POA: Diagnosis not present

## 2020-07-01 DIAGNOSIS — Z961 Presence of intraocular lens: Secondary | ICD-10-CM | POA: Diagnosis not present

## 2020-07-01 DIAGNOSIS — H518 Other specified disorders of binocular movement: Secondary | ICD-10-CM | POA: Diagnosis not present

## 2020-07-01 DIAGNOSIS — H5030 Unspecified intermittent heterotropia: Secondary | ICD-10-CM | POA: Diagnosis not present

## 2020-10-09 DIAGNOSIS — H04123 Dry eye syndrome of bilateral lacrimal glands: Secondary | ICD-10-CM | POA: Diagnosis not present

## 2020-10-09 DIAGNOSIS — H18593 Other hereditary corneal dystrophies, bilateral: Secondary | ICD-10-CM | POA: Diagnosis not present

## 2020-10-09 DIAGNOSIS — H532 Diplopia: Secondary | ICD-10-CM | POA: Diagnosis not present

## 2020-10-09 DIAGNOSIS — H524 Presbyopia: Secondary | ICD-10-CM | POA: Diagnosis not present

## 2020-11-23 ENCOUNTER — Encounter: Payer: Self-pay | Admitting: Dermatology

## 2020-11-23 ENCOUNTER — Other Ambulatory Visit: Payer: Self-pay

## 2020-11-23 ENCOUNTER — Telehealth: Payer: Self-pay | Admitting: Dermatology

## 2020-11-23 ENCOUNTER — Ambulatory Visit: Payer: PPO | Admitting: Dermatology

## 2020-11-23 DIAGNOSIS — L719 Rosacea, unspecified: Secondary | ICD-10-CM | POA: Diagnosis not present

## 2020-11-23 DIAGNOSIS — Z1283 Encounter for screening for malignant neoplasm of skin: Secondary | ICD-10-CM | POA: Diagnosis not present

## 2020-11-23 DIAGNOSIS — L821 Other seborrheic keratosis: Secondary | ICD-10-CM | POA: Diagnosis not present

## 2020-11-23 DIAGNOSIS — F458 Other somatoform disorders: Secondary | ICD-10-CM | POA: Diagnosis not present

## 2020-11-23 MED ORDER — TACROLIMUS 0.1 % EX OINT: TOPICAL_OINTMENT | Freq: Two times a day (BID) | CUTANEOUS | 4 refills | Status: DC

## 2020-11-23 NOTE — Telephone Encounter (Signed)
Patient is calling about the prescription for Tacrolimus that was sent to [facility].  Patient states that her copay is $100.00 for this medication and wants to know if there is anything cheaper that can be prescribed.

## 2020-11-23 NOTE — Telephone Encounter (Signed)
See patient's message.

## 2020-11-24 NOTE — Telephone Encounter (Signed)
Protopic: Under $40 Kristopher Oppenheim with SingleCare coupon. When possible please check these sources before forwarding the message.

## 2020-11-24 NOTE — Telephone Encounter (Signed)
Called patient and let her know to use goodrx for cash pay. Patient understood, told to call us if needed.

## 2020-12-03 ENCOUNTER — Encounter: Payer: Self-pay | Admitting: Dermatology

## 2020-12-03 NOTE — Progress Notes (Signed)
   Follow-Up Visit   Subjective  Audrey Sweeney is a 75 y.o. female who presents for the following: Skin Problem (Red spots on right cheek x 1 year- no itch no bleed- also wants patch on back and right knee checked).  General skin check, frequent itching right upper back Location:  Duration:  Quality:  Associated Signs/Symptoms: Modifying Factors:  Severity:  Timing: Context:   Objective  Well appearing patient in no apparent distress; mood and affect are within normal limits. Objective  Left Inguinal Area: General skin examination, no atypical moles or pigmented lesions or nonmelanoma skin cancer  Objective  Right Malar Cheek: Central facial patchy erythema with slight scale, no pustules.  This is an intergrade between seborrheic dermatitis and rosacea.  Objective  Right Upper Back: Flattopped textured brown 5 mm papule  Objective  Right Upper Back: Itching without visible rash or inflamed growth.    A full examination was performed including scalp, head, eyes, ears, nose, lips, neck, chest, axillae, abdomen, back, buttocks, bilateral upper extremities, bilateral lower extremities, hands, feet, fingers, toes, fingernails, and toenails. All findings within normal limits unless otherwise noted below.  Areas beneath undergarments not fully examined.   Assessment & Plan    Encounter for screening for malignant neoplasm of skin Left Inguinal Area  Annual skin examination, patient encouraged to self examine twice annually.  Rosacea Right Malar Cheek  We will initially try generic Protopic ointment nightly for 3 weeks; follow-up by MyChart at that time.  Ordered Medications: tacrolimus (PROTOPIC) 0.1 % ointment  Seborrheic keratosis Right Upper Back  Leave if stable  Neurogenic pruritus Right Upper Back  Over the counter cerave anti-itch or any other product containing the ingredient pramoxine.      I, Lavonna Monarch, MD, have reviewed all documentation  for this visit.  The documentation on 12/03/20 for the exam, diagnosis, procedures, and orders are all accurate and complete.

## 2020-12-08 DIAGNOSIS — R062 Wheezing: Secondary | ICD-10-CM | POA: Diagnosis not present

## 2020-12-08 DIAGNOSIS — R059 Cough, unspecified: Secondary | ICD-10-CM | POA: Diagnosis not present

## 2020-12-31 DIAGNOSIS — Z0000 Encounter for general adult medical examination without abnormal findings: Secondary | ICD-10-CM | POA: Diagnosis not present

## 2020-12-31 DIAGNOSIS — I10 Essential (primary) hypertension: Secondary | ICD-10-CM | POA: Diagnosis not present

## 2021-02-04 ENCOUNTER — Other Ambulatory Visit: Payer: Self-pay

## 2021-02-04 ENCOUNTER — Other Ambulatory Visit: Payer: Self-pay | Admitting: Family Medicine

## 2021-02-04 ENCOUNTER — Encounter: Payer: Self-pay | Admitting: Obstetrics and Gynecology

## 2021-02-04 ENCOUNTER — Ambulatory Visit (INDEPENDENT_AMBULATORY_CARE_PROVIDER_SITE_OTHER): Payer: PPO | Admitting: Obstetrics and Gynecology

## 2021-02-04 VITALS — BP 132/64 | HR 66 | Ht 64.75 in | Wt 141.0 lb

## 2021-02-04 DIAGNOSIS — Z01419 Encounter for gynecological examination (general) (routine) without abnormal findings: Secondary | ICD-10-CM | POA: Diagnosis not present

## 2021-02-04 DIAGNOSIS — Z1231 Encounter for screening mammogram for malignant neoplasm of breast: Secondary | ICD-10-CM

## 2021-02-04 DIAGNOSIS — N3946 Mixed incontinence: Secondary | ICD-10-CM

## 2021-02-04 DIAGNOSIS — N952 Postmenopausal atrophic vaginitis: Secondary | ICD-10-CM

## 2021-02-04 DIAGNOSIS — Z8739 Personal history of other diseases of the musculoskeletal system and connective tissue: Secondary | ICD-10-CM

## 2021-02-04 MED ORDER — ESTRADIOL 10 MCG VA TABS: ORAL_TABLET | VAGINAL | 3 refills | Status: DC

## 2021-02-04 NOTE — Patient Instructions (Addendum)
EXERCISE   We recommended that you start or continue a regular exercise program for good health. Physical activity is anything that gets your body moving, some is better than none. The CDC recommends 150 minutes per week of Moderate-Intensity Aerobic Activity and 2 or more days of Muscle Strengthening Activity.  Benefits of exercise are limitless: helps weight loss/weight maintenance, improves mood and energy, helps with depression and anxiety, improves sleep, tones and strengthens muscles, improves balance, improves bone density, protects from chronic conditions such as heart disease, high blood pressure and diabetes and so much more. To learn more visit: https://www.cdc.gov/physicalactivity/index.html  DIET: Good nutrition starts with a healthy diet of fruits, vegetables, whole grains, and lean protein sources. Drink plenty of water for hydration. Minimize empty calories, sodium, sweets. For more information about dietary recommendations visit: https://health.gov/our-work/nutrition-physical-activity/dietary-guidelines and https://www.myplate.gov/  ALCOHOL:  Women should limit their alcohol intake to no more than 7 drinks/beers/glasses of wine (combined, not each!) per week. Moderation of alcohol intake to this level decreases your risk of breast cancer and liver damage.  If you are concerned that you may have a problem, or your friends have told you they are concerned about your drinking, there are many resources to help. A well-known program that is free, effective, and available to all people all over the nation is Alcoholics Anonymous.  Check out this site to learn more: https://www.aa.org/   CALCIUM AND VITAMIN D:  Adequate intake of calcium and Vitamin D are recommended for bone health.  You should be getting between 1000-1200 mg of calcium and 800 units of Vitamin D daily between diet and supplements  PAP SMEARS:  Pap smears, to check for cervical cancer or precancers,  have traditionally been  done yearly, scientific advances have shown that most women can have pap smears less often.  However, every woman still should have a physical exam from her gynecologist every year. It will include a breast check, inspection of the vulva and vagina to check for abnormal growths or skin changes, a visual exam of the cervix, and then an exam to evaluate the size and shape of the uterus and ovaries. We will also provide age appropriate advice regarding health maintenance, like when you should have certain vaccines, screening for sexually transmitted diseases, bone density testing, colonoscopy, mammograms, etc.   MAMMOGRAMS:  All women over 40 years old should have a routine mammogram.   COLON CANCER SCREENING: Now recommend starting at age 45. At this time colonoscopy is not covered for routine screening until 50. There are take home tests that can be done between 45-49.   COLONOSCOPY:  Colonoscopy to screen for colon cancer is recommended for all women at age 50.  We know, you hate the idea of the prep.  We agree, BUT, having colon cancer and not knowing it is worse!!  Colon cancer so often starts as a polyp that can be seen and removed at colonscopy, which can quite literally save your life!  And if your first colonoscopy is normal and you have no family history of colon cancer, most women don't have to have it again for 10 years.  Once every ten years, you can do something that may end up saving your life, right?  We will be happy to help you get it scheduled when you are ready.  Be sure to check your insurance coverage so you understand how much it will cost.  It may be covered as a preventative service at no cost, but you should check   your particular policy.      Breast Self-Awareness Breast self-awareness means being familiar with how your breasts look and feel. It involves checking your breasts regularly and reporting any changes to your health care provider. Practicing breast self-awareness is  important. A change in your breasts can be a sign of a serious medical problem. Being familiar with how your breasts look and feel allows you to find any problems early, when treatment is more likely to be successful. All women should practice breast self-awareness, including women who have had breast implants. How to do a breast self-exam One way to learn what is normal for your breasts and whether your breasts are changing is to do a breast self-exam. To do a breast self-exam: Look for Changes  Remove all the clothing above your waist. Stand in front of a mirror in a room with good lighting. Put your hands on your hips. Push your hands firmly downward. Compare your breasts in the mirror. Look for differences between them (asymmetry), such as: Differences in shape. Differences in size. Puckers, dips, and bumps in one breast and not the other. Look at each breast for changes in your skin, such as: Redness. Scaly areas. Look for changes in your nipples, such as: Discharge. Bleeding. Dimpling. Redness. A change in position. Feel for Changes Carefully feel your breasts for lumps and changes. It is best to do this while lying on your back on the floor and again while sitting or standing in the shower or tub with soapy water on your skin. Feel each breast in the following way: Place the arm on the side of the breast you are examining above your head. Feel your breast with the other hand. Start in the nipple area and make  inch (2 cm) overlapping circles to feel your breast. Use the pads of your three middle fingers to do this. Apply light pressure, then medium pressure, then firm pressure. The light pressure will allow you to feel the tissue closest to the skin. The medium pressure will allow you to feel the tissue that is a little deeper. The firm pressure will allow you to feel the tissue close to the ribs. Continue the overlapping circles, moving downward over the breast until you feel your  ribs below your breast. Move one finger-width toward the center of the body. Continue to use the  inch (2 cm) overlapping circles to feel your breast as you move slowly up toward your collarbone. Continue the up and down exam using all three pressures until you reach your armpit.  Write Down What You Find  Write down what is normal for each breast and any changes that you find. Keep a written record with breast changes or normal findings for each breast. By writing this information down, you do not need to depend only on memory for size, tenderness, or location. Write down where you are in your menstrual cycle, if you are still menstruating. If you are having trouble noticing differences in your breasts, do not get discouraged. With time you will become more familiar with the variations in your breasts and more comfortable with the exam. How often should I examine my breasts? Examine your breasts every month. If you are breastfeeding, the best time to examine your breasts is after a feeding or after using a breast pump. If you menstruate, the best time to examine your breasts is 5-7 days after your period is over. During your period, your breasts are lumpier, and it may be more   difficult to notice changes. When should I see my health care provider? See your health care provider if you notice: A change in shape or size of your breasts or nipples. A change in the skin of your breast or nipples, such as a reddened or scaly area. Unusual discharge from your nipples. A lump or thick area that was not there before. Pain in your breasts. Anything that concerns you. Urinary Incontinence  Urinary incontinence refers to a condition in which a person is unable to control where and when to pass urine. A person with this condition will urinate when he or she does not mean to (involuntarily). What are the causes? This condition may be caused by: Medicines. Infections. Constipation. Overactive bladder  muscles. Weak bladder muscles. Weak pelvic floor muscles. These muscles provide support for the bladder, intestine, and, in women, the uterus. Enlarged prostate in men. The prostate is a gland near the bladder. When it gets too big, it can pinch the urethra. With the urethra blocked, the bladder can weaken and lose the ability to empty properly. Surgery. Emotional factors, such as anxiety, stress, or post-traumatic stress disorder (PTSD). Pelvic organ prolapse. This happens in women when organs shift out of place and into the vagina. This shift can prevent the bladder and urethra from working properly. What increases the risk? The following factors may make you more likely to develop this condition: Older age. Obesity and physical inactivity. Pregnancy and childbirth. Menopause. Diseases that affect the nerves or spinal cord (neurological diseases). Long-term (chronic) coughing. This can increase pressure on the bladder and pelvic floor muscles. What are the signs or symptoms? Symptoms may vary depending on the type of urinary incontinence you have. They include: A sudden urge to urinate, but passing urine involuntarily before you can get to a bathroom (urge incontinence). Suddenly passing urine with any activity that forces urine to pass, such as coughing, laughing, exercise, or sneezing (stress incontinence). Needing to urinate often, but urinating only a small amount, or constantly dribbling urine (overflow incontinence). Urinating because you cannot get to the bathroom in time due to a physical disability, such as arthritis or injury, or communication and thinking problems, such as Alzheimer disease (functional incontinence). How is this diagnosed? This condition may be diagnosed based on: Your medical history. A physical exam. Tests, such as: Urine tests. X-rays of your kidney and bladder. Ultrasound. CT scan. Cystoscopy. In this procedure, a health care provider inserts a tube  with a light and camera (cystoscope) through the urethra and into the bladder in order to check for problems. Urodynamic testing. These tests assess how well the bladder, urethra, and sphincter can store and release urine. There are different types of urodynamic tests, and they vary depending on what the test is measuring. To help diagnose your condition, your health care provider may recommend thatyou keep a log of when you urinate and how much you urinate. How is this treated? Treatment for this condition depends on the type of incontinence that you have and its cause. Treatment may include: Lifestyle changes, such as: Quitting smoking. Maintaining a healthy weight. Staying active. Try to get 150 minutes of moderate-intensity exercise every week. Ask your health care provider which activities are safe for you. Eating a healthy diet. Avoid high-fat foods, like fried foods. Avoid refined carbohydrates like white bread and white rice. Limit how much alcohol and caffeine you drink. Increase your fiber intake. Foods such as fresh fruits, vegetables, beans, and whole grains are healthy sources of  fiber. Pelvic floor muscle exercises. Bladder training, such as lengthening the amount of time between bathroom breaks, or using the bathroom at regular intervals. Using techniques to suppress bladder urges. This can include distraction techniques or controlled breathing exercises. Medicines to relax the bladder muscles and prevent bladder spasms. Medicines to help slow or prevent the growth of a man's prostate. Botox injections. These can help relax the bladder muscles. Using pulses of electricity to help change bladder reflexes (electrical nerve stimulation). For women, using a medical device to prevent urine leaks. This is a small, tampon-like, disposable device that is inserted into the urethra. Injecting collagen or carbon beads (bulking agents) into the urinary sphincter. These can help thicken  tissue and close the bladder opening. Surgery. Follow these instructions at home: Lifestyle Limit alcohol and caffeine. These can fill your bladder quickly and irritate it. Keep yourself clean to help prevent odors and skin damage. Ask your doctor about special skin creams and cleansers that can protect the skin from urine. Consider wearing pads or adult diapers. Make sure to change them regularly, and always change them right after experiencing incontinence. General instructions Take over-the-counter and prescription medicines only as told by your health care provider. Use the bathroom about every 3-4 hours, even if you do not feel the need to urinate. Try to empty your bladder completely every time. After urinating, wait a minute. Then try to urinate again. Make sure you are in a relaxed position while urinating. If your incontinence is caused by nerve problems, keep a log of the medicines you take and the times you go to the bathroom. Keep all follow-up visits as told by your health care provider. This is important. Contact a health care provider if: You have pain that gets worse. Your incontinence gets worse. Get help right away if: You have a fever or chills. You are unable to urinate. You have redness in your groin area or down your legs. Summary Urinary incontinence refers to a condition in which a person is unable to control where and when to pass urine. This condition may be caused by medicines, infection, weak bladder muscles, weak pelvic floor muscles, enlargement of the prostate (in men), or surgery. The following factors increase your risk for developing this condition: older age, obesity, pregnancy and childbirth, menopause, neurological diseases, and chronic coughing. There are several types of urinary incontinence. They include urge incontinence, stress incontinence, overflow incontinence, and functional incontinence. This condition is usually treated first with lifestyle and  behavioral changes, such as quitting smoking, eating a healthier diet, and doing regular pelvic floor exercises. Other treatment options include medicines, bulking agents, medical devices, electrical nerve stimulation, or surgery. This information is not intended to replace advice given to you by your health care provider. Make sure you discuss any questions you have with your healthcare provider. Document Revised: 07/28/2017 Document Reviewed: 10/27/2016 Elsevier Patient Education  [place]. Kegel Exercises  Kegel exercises can help strengthen your pelvic floor muscles. The pelvic floor is a group of muscles that support your rectum, small intestine, and bladder. In females, pelvic floor muscles also help support the womb (uterus). These muscles help you control the flow of urine and stool. Kegel exercises are painless and simple, and they do not require any equipment. Your provider may suggest Kegel exercises to: Improve bladder and bowel control. Improve sexual response. Improve weak pelvic floor muscles after surgery to remove the uterus (hysterectomy) or pregnancy (females). Improve weak pelvic floor muscles after prostate gland  removal or surgery (males). Kegel exercises involve squeezing your pelvic floor muscles, which are the same muscles you squeeze when you try to stop the flow of urine or keep from passing gas. The exercises can be done while sitting, standing, or lying down, but itis best to vary your position. Exercises How to do Kegel exercises: Squeeze your pelvic floor muscles tight. You should feel a tight lift in your rectal area. If you are a female, you should also feel a tightness in your vaginal area. Keep your stomach, buttocks, and legs relaxed. Hold the muscles tight for up to 10 seconds. Breathe normally. Relax your muscles. Repeat as told by your health care provider. Repeat this exercise daily as told by your health care provider. Continue to do this  exercise for at least 4-6 weeks, or for as long as told by your healthcare provider. You may be referred to a physical therapist who can help you learn more abouthow to do Kegel exercises. Depending on your condition, your health care provider may recommend: Varying how long you squeeze your muscles. Doing several sets of exercises every day. Doing exercises for several weeks. Making Kegel exercises a part of your regular exercise routine. This information is not intended to replace advice given to you by your health care provider. Make sure you discuss any questions you have with your healthcare provider. Document Revised: 07/08/2020 Document Reviewed: 03/07/2018 Elsevier Patient Education  [employer].

## 2021-02-04 NOTE — Progress Notes (Signed)
75 y.o. G3P3 Married White or Caucasian Not Hispanic or Latino female here for annual exam.     H/O mixed urinary incontinence. She is leaking a couple of times a week, stable. Leaking a couple of times a week, small amounts. Leaks a small amount with cough or sneeze.  She has a cup of coffee in the am, in the summer she drinks ice tea.   Sexually active, some dryness, helped with vaginal estrogen. Uses a lubricant.  No vaginal bleeding.   No bowel c/o.   H/O CLL, in remission.  H/O osteoporosis, following her primary. She has been resistant to medication.   Patient's last menstrual period was 08/01/2006.          Sexually active: Yes.    The current method of family planning is post menopausal status.    Exercising: Yes.     Walking  Smoker:  no  Health Maintenance: Pap:  01/22/19 WNL HPV Neg   12-01-16 neg  History of abnormal Pap:  no MMG:  01/30/20 negative, scheduled in 8/22. BMD:    03/27/19 osteoporotic, T score -3.2 (she will f/u with her primary) Colonoscopy: 2017 neg f/u 39yrs, she will get it set up   TDaP:  2018  Gardasil: none   reports that she has never smoked. She has never used smokeless tobacco. She reports that she does not drink alcohol and does not use drugs. Works for a [employer], part time (there x 25 years). Son is local, Daughter in [place], other son in [place]. 9 grand children (7-29).   Past Medical History:  Diagnosis Date   Abnormal MRI    Abnormal Pap smear of cervix    ?   Ankylosing spondylitis (HCC)    Arthritis    C. difficile colitis    Cervical disc disease    CLL (chronic lymphocytic leukemia) (HCC)    Rai stage 0.   Colon polyps    Depression    Hypercholesterolemia    Hypertension    IBS (irritable bowel syndrome)    Osteopenia    Osteoporosis    Rheumatoid arthritis(714.0)    Seasonal allergies    Statin intolerance     Past Surgical History:  Procedure Laterality Date   CESAREAN SECTION     COLONOSCOPY W/  POLYPECTOMY     Pre-cancerous polyps removed.   ENDOMETRIAL BIOPSY     8/98   TUBAL LIGATION      Current Outpatient Medications  Medication Sig Dispense Refill   Acetaminophen (TYLENOL PO) Take by mouth.     cholecalciferol (VITAMIN D3) 25 MCG (1000 UNIT) tablet Take 2,000 Units by mouth daily.     Estradiol 10 MCG TABS vaginal tablet INSERT 1 TABLET VAGINALLY TWICE A WEEK. 24 tablet 3   fluticasone (FLONASE) 50 MCG/ACT nasal spray Place into both nostrils as needed for allergies or rhinitis.     hydrochlorothiazide (HYDRODIURIL) 25 MG tablet Take 0.5 tablets by mouth daily.     ibuprofen (ADVIL) 200 MG tablet      Polyethyl Glycol-Propyl Glycol (SYSTANE PRESERVATIVE FREE OP) Apply to eye.     Probiotic Product (PROBIOTIC PO) Take by mouth.     tacrolimus (PROTOPIC) 0.1 % ointment Apply topically 2 (two) times daily. 100 g 4   No current facility-administered medications for this visit.    Family History  Problem Relation Age of Onset   Colon cancer Mother    Stroke Father    Diabetes Father  Heart disease Father    Hypertension Father    Heart disease Maternal Grandmother    Diabetes Maternal Grandmother    Cancer Paternal Grandmother        female cancer   Emphysema Maternal Grandfather    Breast cancer Cousin        passed away age 29   Obesity Sister    Diabetes Sister        gestational    Obesity Brother    Diabetes Paternal Grandfather    Seizures Daughter        9 y.o. happened twice this year, diagnosed with Epilepsy thought to be from hormonal imbalance   Obesity Brother    Obesity Sister     Review of Systems  All other systems reviewed and are negative.  Exam:   BP 132/64   Pulse 66   Ht 5' 4.75" (1.645 m)   Wt 141 lb (64 kg)   LMP 08/01/2006   SpO2 100%   BMI 23.65 kg/m   Weight change: @WEIGHTCHANGE @ Height:   Height: 5' 4.75" (164.5 cm)  Ht Readings from Last 3 Encounters:  02/04/21 5' 4.75" (1.645 m)  05/25/20 5' 5.5" (1.664 m)   01/31/20 5\' 5"  (1.651 m)    General appearance: alert, cooperative and appears stated age Head: Normocephalic, without obvious abnormality, atraumatic Neck: no adenopathy, supple, symmetrical, trachea midline and thyroid normal to inspection and palpation Breasts: normal appearance, no masses or tenderness Abdomen: soft, non-tender; non distended,  no masses,  no organomegaly Extremities: extremities normal, atraumatic, no cyanosis or edema Skin: Skin color, texture, turgor normal. No rashes or lesions Lymph nodes: Cervical, supraclavicular, and axillary nodes normal. No abnormal inguinal nodes palpated Neurologic: Grossly normal   Pelvic: External genitalia:  no lesions              Urethra:  normal appearing urethra with no masses, tenderness or lesions              Bartholins and Skenes: normal                 Vagina: atrophic appearing vagina with normal color and discharge, no lesions              Cervix: no lesions               Bimanual Exam:  Uterus:  normal size, contour, position, consistency, mobility, non-tender              Adnexa: no mass, fullness, tenderness               Rectovaginal: Confirms               Anus:  normal sphincter tone, no lesions  Gae Dry chaperoned for the exam.  1. Encounter for gynecological examination without abnormal finding Discussed breast self exam She will call to schedule her colonoscopy Mammogram scheduled  2. Vaginal atrophy Helped with vaginal estrogen Using lubrication as needed - Estradiol 10 MCG TABS vaginal tablet; INSERT 1 TABLET VAGINALLY TWICE A WEEK.  Dispense: 24 tablet; Refill: 3  3. Mixed incontinence Information on Kegels and bladder training given Discussed option of PT  4. History of osteoporosis She will do her DEXA with her primary. We did discuss that the benefits of medication outweigh the risks. Reviewed that the risk of osteonecrosis of the jaw with Fosamax is less than 1 in 10,000 people years.   Discussed calcium and vit D intake Discussed exercise  In  addition to the breast and pelvic exam, over 20 minutes was spent in counseling about incontinence and treatment for osteoporosis. Vaginal estrogen refilled.

## 2021-02-05 ENCOUNTER — Encounter: Payer: Self-pay | Admitting: Hematology and Oncology

## 2021-02-05 ENCOUNTER — Inpatient Hospital Stay: Payer: PPO | Attending: Hematology and Oncology | Admitting: Hematology and Oncology

## 2021-02-05 ENCOUNTER — Inpatient Hospital Stay: Payer: PPO

## 2021-02-05 DIAGNOSIS — Z79899 Other long term (current) drug therapy: Secondary | ICD-10-CM | POA: Diagnosis not present

## 2021-02-05 DIAGNOSIS — Z887 Allergy status to serum and vaccine status: Secondary | ICD-10-CM | POA: Diagnosis not present

## 2021-02-05 DIAGNOSIS — Z880 Allergy status to penicillin: Secondary | ICD-10-CM | POA: Diagnosis not present

## 2021-02-05 DIAGNOSIS — Z882 Allergy status to sulfonamides status: Secondary | ICD-10-CM | POA: Insufficient documentation

## 2021-02-05 DIAGNOSIS — C9110 Chronic lymphocytic leukemia of B-cell type not having achieved remission: Secondary | ICD-10-CM | POA: Insufficient documentation

## 2021-02-05 LAB — CBC WITH DIFFERENTIAL/PLATELET
Abs Immature Granulocytes: 0.02 10*3/uL (ref 0.00–0.07)
Basophils Absolute: 0 10*3/uL (ref 0.0–0.1)
Basophils Relative: 1 %
Eosinophils Absolute: 0.1 10*3/uL (ref 0.0–0.5)
Eosinophils Relative: 2 %
HCT: 34 % — ABNORMAL LOW (ref 36.0–46.0)
Hemoglobin: 11.7 g/dL — ABNORMAL LOW (ref 12.0–15.0)
Immature Granulocytes: 0 %
Lymphocytes Relative: 41 %
Lymphs Abs: 2.6 10*3/uL (ref 0.7–4.0)
MCH: 28.6 pg (ref 26.0–34.0)
MCHC: 34.4 g/dL (ref 30.0–36.0)
MCV: 83.1 fL (ref 80.0–100.0)
Monocytes Absolute: 0.5 10*3/uL (ref 0.1–1.0)
Monocytes Relative: 8 %
Neutro Abs: 2.9 10*3/uL (ref 1.7–7.7)
Neutrophils Relative %: 48 %
Platelets: 248 10*3/uL (ref 150–400)
RBC: 4.09 MIL/uL (ref 3.87–5.11)
RDW: 12.6 % (ref 11.5–15.5)
WBC: 6.2 10*3/uL (ref 4.0–10.5)
nRBC: 0 % (ref 0.0–0.2)

## 2021-02-05 NOTE — Assessment & Plan Note (Signed)
She has no detectable abnormal lymphocytosis Examination is benign We discussed the signs and symptoms to watch out for We discussed importance of annual influenza vaccination I will see her back in a year for further follow-up  

## 2021-02-05 NOTE — Progress Notes (Signed)
[place] OFFICE PROGRESS NOTE  Patient Care Team: Leighton Ruff, MD as PCP - General (Family Medicine) Heath Lark, MD as Consulting Physician (Hematology and Oncology) Lavonna Monarch, MD as Consulting Physician (Dermatology)  ASSESSMENT & PLAN:  CLL (chronic lymphocytic leukemia) [facility]) She has no detectable abnormal lymphocytosis Examination is benign We discussed the signs and symptoms to watch out for We discussed importance of annual influenza vaccination I will see her back in a year for further follow-up   Orders Placed This Encounter  Procedures   CBC with Differential/Platelet    Standing Status:   Standing    Number of Occurrences:   22    Standing Expiration Date:   02/05/2022    All questions were answered. The patient knows to call the clinic with any problems, questions or concerns. The total time spent in the appointment was 20 minutes encounter with patients including review of chart and various tests results, discussions about plan of care and coordination of care plan   Heath Lark, MD 02/05/2021 5:50 PM  INTERVAL HISTORY: Please see below for problem oriented charting. She returns for further follow-up She is doing very well No new lymphadenopathy Denies recent infection, fever or chills  SUMMARY OF ONCOLOGIC HISTORY: Oncology History  CLL (chronic lymphocytic leukemia) ([place])  02/24/2004 Pathology Results    Case #: [id number] confirmed CLL    01/23/2017 Pathology Results   Peripheral Blood Flow Cytometry - CD5-POSITIVE MONOCLONAL B CELL POPULATION MOST CONSISTENT WITH CHRONIC LYMPHOCYTIC LEUKEMIA - SEE COMMENT Diagnosis Comment: Examination of the peripheral blood reveals a lymphocytosis with frequent small lymphocytes that have clumped chromatin and scant cytoplasm . Larger cells with prominent nucleoli are not identified. By flow cytometry, a B-cell population that expresses CD5, CD19, CD20, CD21, CD22, HLA-DR and CD23 comprises 46%  of all lymphocytes. Overall, the morphology and immunophenotype are consistent with the patient's history of chronic lymphocytic leukemia    01/23/2017 Pathology Results   FISh confirmed deletion 13 q     01/23/2018 Cancer Staging   Staging form: Chronic Lymphocytic Leukemia / Small Lymphocytic Lymphoma, AJCC 8th Edition - Clinical: Modified Rai Stage 0 (Modified Rai risk: Low, Lymphocytosis: Present, Adenopathy: Absent, Organomegaly: Absent, Anemia: Absent, Thrombocytopenia: Absent) - Signed by Heath Lark, MD on 01/23/2018      REVIEW OF SYSTEMS:   Constitutional: Denies fevers, chills or abnormal weight loss Eyes: Denies blurriness of vision Ears, nose, mouth, throat, and face: Denies mucositis or sore throat Respiratory: Denies cough, dyspnea or wheezes Cardiovascular: Denies palpitation, chest discomfort or lower extremity swelling Gastrointestinal:  Denies nausea, heartburn or change in bowel habits Skin: Denies abnormal skin rashes Lymphatics: Denies new lymphadenopathy or easy bruising Neurological:Denies numbness, tingling or new weaknesses Behavioral/Psych: Mood is stable, no new changes  All other systems were reviewed with the patient and are negative.  I have reviewed the past medical history, past surgical history, social history and family history with the patient and they are unchanged from previous note.  ALLERGIES:  is allergic to contrast media [iodinated diagnostic agents], amoxicillin, influenza virus vac live quad, lipitor [atorvastatin], other, provera [medroxyprogesterone acetate], sulfa antibiotics, and tetanus toxoids.  MEDICATIONS:  Current Outpatient Medications  Medication Sig Dispense Refill   Multiple Vitamin (MULTIVITAMIN) LIQD Take 5 mLs by mouth daily.     Acetaminophen (TYLENOL PO) Take by mouth.     cholecalciferol (VITAMIN D3) 25 MCG (1000 UNIT) tablet Take 2,000 Units by mouth daily.     Estradiol 10 MCG TABS  vaginal tablet INSERT 1 TABLET  VAGINALLY TWICE A WEEK. 24 tablet 3   fluticasone (FLONASE) 50 MCG/ACT nasal spray Place into both nostrils as needed for allergies or rhinitis.     hydrochlorothiazide (HYDRODIURIL) 25 MG tablet Take 0.5 tablets by mouth daily.     ibuprofen (ADVIL) 200 MG tablet      Polyethyl Glycol-Propyl Glycol (SYSTANE PRESERVATIVE FREE OP) Apply to eye.     Probiotic Product (PROBIOTIC PO) Take by mouth.     tacrolimus (PROTOPIC) 0.1 % ointment Apply topically 2 (two) times daily. 100 g 4   No current facility-administered medications for this visit.    PHYSICAL EXAMINATION: ECOG PERFORMANCE STATUS: 0 - Asymptomatic  Vitals:   02/05/21 0959  BP: 135/64  Pulse: 74  Resp: 17  Temp: 98.1 F (36.7 C)  SpO2: 100%   Filed Weights   02/05/21 0959  Weight: 140 lb 12.8 oz (63.9 kg)    GENERAL:alert, no distress and comfortable SKIN: skin color, texture, turgor are normal, no rashes or significant lesions EYES: normal, Conjunctiva are pink and non-injected, sclera clear OROPHARYNX:no exudate, no erythema and lips, buccal mucosa, and tongue normal  NECK: supple, thyroid normal size, non-tender, without nodularity LYMPH:  no palpable lymphadenopathy in the cervical, axillary or inguinal LUNGS: clear to auscultation and percussion with normal breathing effort HEART: regular rate & rhythm and no murmurs and no lower extremity edema ABDOMEN:abdomen soft, non-tender and normal bowel sounds Musculoskeletal:no cyanosis of digits and no clubbing  NEURO: alert & oriented x 3 with fluent speech, no focal motor/sensory deficits  LABORATORY DATA:  I have reviewed the data as listed    Component Value Date/Time   NA 141 05/04/2018 1249   NA 139 08/25/2014 1309   K 4.0 05/04/2018 1249   K 3.8 08/25/2014 1309   CL 106 05/04/2018 1249   CL 102 08/29/2012 0934   CO2 28 05/04/2018 1249   CO2 23 08/25/2014 1309   GLUCOSE 92 05/04/2018 1249   GLUCOSE 99 08/25/2014 1309   GLUCOSE 100 (H) 08/29/2012 0934    BUN 13 05/04/2018 1249   BUN 13.8 08/25/2014 1309   CREATININE 0.72 05/04/2018 1249   CREATININE 0.6 08/25/2014 1309   CALCIUM 9.8 05/04/2018 1249   CALCIUM 9.8 08/25/2014 1309   PROT 6.8 05/04/2018 1249   PROT 6.8 08/25/2014 1309   ALBUMIN 4.2 05/04/2018 1249   ALBUMIN 4.4 08/25/2014 1309   AST 19 05/04/2018 1249   AST 18 08/25/2014 1309   ALT 20 05/04/2018 1249   ALT 21 08/25/2014 1309   ALKPHOS 108 05/04/2018 1249   ALKPHOS 111 08/25/2014 1309   BILITOT 0.6 05/04/2018 1249   BILITOT 0.54 08/25/2014 1309   GFRNONAA >60 05/04/2018 1249   GFRAA >60 05/04/2018 1249    No results found for: SPEP, UPEP  Lab Results  Component Value Date   WBC 6.2 02/05/2021   NEUTROABS 2.9 02/05/2021   HGB 11.7 (L) 02/05/2021   HCT 34.0 (L) 02/05/2021   MCV 83.1 02/05/2021   PLT 248 02/05/2021      Chemistry      Component Value Date/Time   NA 141 05/04/2018 1249   NA 139 08/25/2014 1309   K 4.0 05/04/2018 1249   K 3.8 08/25/2014 1309   CL 106 05/04/2018 1249   CL 102 08/29/2012 0934   CO2 28 05/04/2018 1249   CO2 23 08/25/2014 1309   BUN 13 05/04/2018 1249   BUN 13.8 08/25/2014 1309   CREATININE  0.72 05/04/2018 1249   CREATININE 0.6 08/25/2014 1309      Component Value Date/Time   CALCIUM 9.8 05/04/2018 1249   CALCIUM 9.8 08/25/2014 1309   ALKPHOS 108 05/04/2018 1249   ALKPHOS 111 08/25/2014 1309   AST 19 05/04/2018 1249   AST 18 08/25/2014 1309   ALT 20 05/04/2018 1249   ALT 21 08/25/2014 1309   BILITOT 0.6 05/04/2018 1249   BILITOT 0.54 08/25/2014 1309

## 2021-02-15 DIAGNOSIS — U071 COVID-19: Secondary | ICD-10-CM | POA: Diagnosis not present

## 2021-03-02 DIAGNOSIS — E559 Vitamin D deficiency, unspecified: Secondary | ICD-10-CM | POA: Diagnosis not present

## 2021-03-02 DIAGNOSIS — R748 Abnormal levels of other serum enzymes: Secondary | ICD-10-CM | POA: Diagnosis not present

## 2021-03-02 DIAGNOSIS — E7800 Pure hypercholesterolemia, unspecified: Secondary | ICD-10-CM | POA: Diagnosis not present

## 2021-03-12 ENCOUNTER — Other Ambulatory Visit: Payer: Self-pay

## 2021-03-12 ENCOUNTER — Ambulatory Visit
Admission: RE | Admit: 2021-03-12 | Discharge: 2021-03-12 | Disposition: A | Discharge status: Home / Self Care | Payer: PPO | Source: Ambulatory Visit | Attending: Family Medicine | Admitting: Family Medicine

## 2021-03-12 DIAGNOSIS — Z1231 Encounter for screening mammogram for malignant neoplasm of breast: Secondary | ICD-10-CM | POA: Diagnosis not present

## 2021-04-23 DIAGNOSIS — H35033 Hypertensive retinopathy, bilateral: Secondary | ICD-10-CM | POA: Diagnosis not present

## 2021-04-23 DIAGNOSIS — H524 Presbyopia: Secondary | ICD-10-CM | POA: Diagnosis not present

## 2021-04-23 DIAGNOSIS — H3554 Dystrophies primarily involving the retinal pigment epithelium: Secondary | ICD-10-CM | POA: Diagnosis not present

## 2021-04-23 DIAGNOSIS — H04123 Dry eye syndrome of bilateral lacrimal glands: Secondary | ICD-10-CM | POA: Diagnosis not present

## 2021-04-23 DIAGNOSIS — H18593 Other hereditary corneal dystrophies, bilateral: Secondary | ICD-10-CM | POA: Diagnosis not present

## 2021-10-19 DIAGNOSIS — H18593 Other hereditary corneal dystrophies, bilateral: Secondary | ICD-10-CM | POA: Diagnosis not present

## 2021-10-19 DIAGNOSIS — H04123 Dry eye syndrome of bilateral lacrimal glands: Secondary | ICD-10-CM | POA: Diagnosis not present

## 2021-11-03 DIAGNOSIS — S99922A Unspecified injury of left foot, initial encounter: Secondary | ICD-10-CM | POA: Diagnosis not present

## 2021-11-03 DIAGNOSIS — S99929A Unspecified injury of unspecified foot, initial encounter: Secondary | ICD-10-CM | POA: Diagnosis not present

## 2021-11-03 DIAGNOSIS — W208XXA Other cause of strike by thrown, projected or falling object, initial encounter: Secondary | ICD-10-CM | POA: Diagnosis not present

## 2021-11-15 DIAGNOSIS — R0981 Nasal congestion: Secondary | ICD-10-CM | POA: Diagnosis not present

## 2021-11-15 DIAGNOSIS — H938X3 Other specified disorders of ear, bilateral: Secondary | ICD-10-CM | POA: Diagnosis not present

## 2021-11-15 DIAGNOSIS — H9193 Unspecified hearing loss, bilateral: Secondary | ICD-10-CM | POA: Diagnosis not present

## 2021-11-15 DIAGNOSIS — H9313 Tinnitus, bilateral: Secondary | ICD-10-CM | POA: Diagnosis not present

## 2021-11-18 DIAGNOSIS — J310 Chronic rhinitis: Secondary | ICD-10-CM | POA: Diagnosis not present

## 2021-11-18 DIAGNOSIS — J343 Hypertrophy of nasal turbinates: Secondary | ICD-10-CM | POA: Diagnosis not present

## 2021-11-18 DIAGNOSIS — J342 Deviated nasal septum: Secondary | ICD-10-CM | POA: Diagnosis not present

## 2021-11-18 DIAGNOSIS — H9123 Sudden idiopathic hearing loss, bilateral: Secondary | ICD-10-CM | POA: Diagnosis not present

## 2021-11-18 DIAGNOSIS — H6983 Other specified disorders of Eustachian tube, bilateral: Secondary | ICD-10-CM | POA: Diagnosis not present

## 2021-11-23 DIAGNOSIS — H6593 Unspecified nonsuppurative otitis media, bilateral: Secondary | ICD-10-CM | POA: Diagnosis not present

## 2021-11-23 DIAGNOSIS — H9313 Tinnitus, bilateral: Secondary | ICD-10-CM | POA: Diagnosis not present

## 2021-11-23 DIAGNOSIS — I10 Essential (primary) hypertension: Secondary | ICD-10-CM | POA: Diagnosis not present

## 2021-11-23 DIAGNOSIS — R202 Paresthesia of skin: Secondary | ICD-10-CM | POA: Diagnosis not present

## 2021-11-25 DIAGNOSIS — H9123 Sudden idiopathic hearing loss, bilateral: Secondary | ICD-10-CM | POA: Diagnosis not present

## 2021-11-25 DIAGNOSIS — H6121 Impacted cerumen, right ear: Secondary | ICD-10-CM | POA: Diagnosis not present

## 2021-11-25 DIAGNOSIS — H6983 Other specified disorders of Eustachian tube, bilateral: Secondary | ICD-10-CM | POA: Diagnosis not present

## 2021-11-26 ENCOUNTER — Other Ambulatory Visit: Payer: Self-pay | Admitting: Otolaryngology

## 2021-11-26 DIAGNOSIS — H918X9 Other specified hearing loss, unspecified ear: Secondary | ICD-10-CM

## 2021-12-01 DIAGNOSIS — W57XXXA Bitten or stung by nonvenomous insect and other nonvenomous arthropods, initial encounter: Secondary | ICD-10-CM | POA: Diagnosis not present

## 2021-12-01 DIAGNOSIS — S1016XA Insect bite (nonvenomous) of throat, initial encounter: Secondary | ICD-10-CM | POA: Diagnosis not present

## 2021-12-06 DIAGNOSIS — S1016XS Insect bite (nonvenomous) of throat, sequela: Secondary | ICD-10-CM | POA: Diagnosis not present

## 2021-12-06 DIAGNOSIS — W57XXXD Bitten or stung by nonvenomous insect and other nonvenomous arthropods, subsequent encounter: Secondary | ICD-10-CM | POA: Diagnosis not present

## 2021-12-13 ENCOUNTER — Ambulatory Visit
Admission: RE | Admit: 2021-12-13 | Discharge: 2021-12-13 | Disposition: A | Discharge status: Home / Self Care | Payer: PPO | Source: Ambulatory Visit | Attending: Otolaryngology | Admitting: Otolaryngology

## 2021-12-13 DIAGNOSIS — H918X9 Other specified hearing loss, unspecified ear: Secondary | ICD-10-CM

## 2021-12-13 DIAGNOSIS — H9042 Sensorineural hearing loss, unilateral, left ear, with unrestricted hearing on the contralateral side: Secondary | ICD-10-CM | POA: Diagnosis not present

## 2021-12-13 MED ORDER — GADOPICLENOL 0.5 MMOL/ML IV SOLN
6.5000 mL | Freq: Once | INTRAVENOUS | Status: AC | PRN
  Administered 2021-12-13: 6.5 mL via INTRAVENOUS

## 2022-01-03 DIAGNOSIS — E7800 Pure hypercholesterolemia, unspecified: Secondary | ICD-10-CM | POA: Diagnosis not present

## 2022-01-03 DIAGNOSIS — M0609 Rheumatoid arthritis without rheumatoid factor, multiple sites: Secondary | ICD-10-CM | POA: Diagnosis not present

## 2022-01-03 DIAGNOSIS — L659 Nonscarring hair loss, unspecified: Secondary | ICD-10-CM | POA: Diagnosis not present

## 2022-01-03 DIAGNOSIS — E559 Vitamin D deficiency, unspecified: Secondary | ICD-10-CM | POA: Diagnosis not present

## 2022-01-03 DIAGNOSIS — R5383 Other fatigue: Secondary | ICD-10-CM | POA: Diagnosis not present

## 2022-01-03 DIAGNOSIS — I10 Essential (primary) hypertension: Secondary | ICD-10-CM | POA: Diagnosis not present

## 2022-01-03 DIAGNOSIS — Z0000 Encounter for general adult medical examination without abnormal findings: Secondary | ICD-10-CM | POA: Diagnosis not present

## 2022-02-02 ENCOUNTER — Other Ambulatory Visit: Payer: Self-pay | Admitting: Family Medicine

## 2022-02-02 DIAGNOSIS — Z1231 Encounter for screening mammogram for malignant neoplasm of breast: Secondary | ICD-10-CM

## 2022-02-02 NOTE — Progress Notes (Signed)
76 y.o. G3P3 Married White or Caucasian Not Hispanic or Latino female here for med check.   She is using vaginal estrogen. Sexually active, no pain. She uses olive oil for lubrication. No vaginal bleeding  She has mixed incontinence, stable. Leaks small amounts 1 x a day. Urge > Stress. Normal bowels.     Patient's last menstrual period was 08/01/2006.          Sexually active: Yes.    The current method of family planning is status post hysterectomy.    Exercising: Yes.     Walking  Smoker:  no  Health Maintenance: Pap:  01/22/19 WNL HPV Neg   12-01-16 neg  History of abnormal Pap:  no MMG:  03/16/21 density B Bi-rads 1 neg  BMD:   03/27/19 osteoporotic, T score -3.2 (she will f/u with her primary) Colonoscopy: 2017 neg f/u 98yr, she will get it set up   TDaP:  2018  Gardasil: none    reports that she has never smoked. She has never used smokeless tobacco. She reports that she does not drink alcohol and does not use drugs. Works for a [employer] part time (there over 25 years). Plans to retire. Son is local, [place] 9 grandchildren, 1 great granddaughter.   Past Medical History:  Diagnosis Date   Abnormal MRI    Abnormal Pap smear of cervix    ?   Ankylosing spondylitis (HCC)    Arthritis    C. difficile colitis    Cervical disc disease    CLL (chronic lymphocytic leukemia) (HCC)    Rai stage 0.   Colon polyps    Depression    Hypercholesterolemia    Hypertension    IBS (irritable bowel syndrome)    Osteopenia    Osteoporosis    Rheumatoid arthritis(714.0)    Seasonal allergies    Statin intolerance     Past Surgical History:  Procedure Laterality Date   CESAREAN SECTION     COLONOSCOPY W/ POLYPECTOMY     Pre-cancerous polyps removed.   ENDOMETRIAL BIOPSY     8/98   TUBAL LIGATION      Current Outpatient Medications  Medication Sig Dispense Refill   Acetaminophen (TYLENOL PO) Take by mouth.     cholecalciferol  (VITAMIN D3) 25 MCG (1000 UNIT) tablet Take 2,000 Units by mouth daily.     hydrochlorothiazide (HYDRODIURIL) 25 MG tablet Take 0.5 tablets by mouth daily.     ibuprofen (ADVIL) 200 MG tablet      Polyethyl Glycol-Propyl Glycol (SYSTANE PRESERVATIVE FREE OP) Apply to eye.     Estradiol 10 MCG TABS vaginal tablet INSERT 1 TABLET VAGINALLY TWICE A WEEK. 24 tablet 3   No current facility-administered medications for this visit.    Family History  Problem Relation Age of Onset   Colon cancer Mother    Stroke Father    Diabetes Father    Heart disease Father    Hypertension Father    Heart disease Maternal Grandmother    Diabetes Maternal Grandmother    Cancer Paternal Grandmother        female cancer   Emphysema Maternal Grandfather    Breast cancer Cousin        passed away age 76  Obesity Sister    Diabetes Sister        gestational    Obesity Brother    Diabetes Paternal Grandfather    Seizures Daughter  23 y.o. happened twice this year, diagnosed with Epilepsy thought to be from hormonal imbalance   Obesity Brother    Obesity Sister     Review of Systems  All other systems reviewed and are negative.   Exam:   BP 122/62   Pulse 72   Ht '5\' 6"'$  (1.676 m)   Wt 142 lb (64.4 kg)   LMP 08/01/2006   SpO2 100%   BMI 22.92 kg/m   Weight change: '@WEIGHTCHANGE'$ @ Height:   Height: '5\' 6"'$  (167.6 cm)  Ht Readings from Last 3 Encounters:  02/07/22 '5\' 6"'$  (1.676 m)  02/04/21 5' 4.75" (1.645 m)  05/25/20 5' 5.5" (1.664 m)    General appearance: alert, cooperative and appears stated age  74. Vaginal atrophy Doing well on vaginal estrogen, wants to continue. Mammogram due in 8/23 - Estradiol 10 MCG TABS vaginal tablet; INSERT 1 TABLET VAGINALLY TWICE A WEEK.  Dispense: 24 tablet; Refill: 3  2. Mixed incontinence Stable, discussed options of physical therapy and medication. She would like to try PT.  - Ambulatory referral to Physical Therapy

## 2022-02-04 ENCOUNTER — Ambulatory Visit: Payer: PPO | Admitting: Hematology and Oncology

## 2022-02-04 ENCOUNTER — Telehealth: Payer: Self-pay | Admitting: *Deleted

## 2022-02-04 ENCOUNTER — Other Ambulatory Visit: Payer: PPO

## 2022-02-04 NOTE — Telephone Encounter (Signed)
Patient called and left detailed message in triage stating her insurance won't cover her breast and pelvic this year because she had one last year. Patient uses vagifem and asked if that will be refilled since she can't come in this year due to insurance? Patient is scheduled on 02/07/22 @ 2:30 pm Please advise

## 2022-02-07 ENCOUNTER — Telehealth: Payer: Self-pay

## 2022-02-07 ENCOUNTER — Ambulatory Visit: Payer: PPO | Admitting: Obstetrics and Gynecology

## 2022-02-07 ENCOUNTER — Encounter: Payer: Self-pay | Admitting: Obstetrics and Gynecology

## 2022-02-07 VITALS — BP 122/62 | HR 72 | Ht 66.0 in | Wt 142.0 lb

## 2022-02-07 DIAGNOSIS — N3946 Mixed incontinence: Secondary | ICD-10-CM

## 2022-02-07 DIAGNOSIS — N952 Postmenopausal atrophic vaginitis: Secondary | ICD-10-CM | POA: Diagnosis not present

## 2022-02-07 MED ORDER — ESTRADIOL 10 MCG VA TABS: ORAL_TABLET | VAGINAL | 3 refills | Status: DC

## 2022-02-07 NOTE — Telephone Encounter (Signed)
Salvadore Dom, MD  P [facility] Triage I've placed referral for [facility] for mixed incontinence.

## 2022-02-07 NOTE — Telephone Encounter (Signed)
She can just come in for a med check, f/u visit. Please let her know and change the schedule.

## 2022-02-07 NOTE — Telephone Encounter (Signed)
Referral/records faxed to [facility].

## 2022-02-07 NOTE — Telephone Encounter (Signed)
Patient informed Alora fixed the appointment. Patient can still come.

## 2022-02-21 ENCOUNTER — Inpatient Hospital Stay: Payer: PPO | Attending: Hematology and Oncology

## 2022-02-21 ENCOUNTER — Other Ambulatory Visit: Payer: Self-pay

## 2022-02-21 ENCOUNTER — Inpatient Hospital Stay: Payer: PPO | Admitting: Hematology and Oncology

## 2022-02-21 ENCOUNTER — Encounter: Payer: Self-pay | Admitting: Hematology and Oncology

## 2022-02-21 DIAGNOSIS — Z79899 Other long term (current) drug therapy: Secondary | ICD-10-CM | POA: Diagnosis not present

## 2022-02-21 DIAGNOSIS — Z887 Allergy status to serum and vaccine status: Secondary | ICD-10-CM | POA: Insufficient documentation

## 2022-02-21 DIAGNOSIS — C9110 Chronic lymphocytic leukemia of B-cell type not having achieved remission: Secondary | ICD-10-CM

## 2022-02-21 DIAGNOSIS — Z880 Allergy status to penicillin: Secondary | ICD-10-CM | POA: Diagnosis not present

## 2022-02-21 DIAGNOSIS — Z882 Allergy status to sulfonamides status: Secondary | ICD-10-CM | POA: Diagnosis not present

## 2022-02-21 LAB — CBC WITH DIFFERENTIAL/PLATELET
Abs Immature Granulocytes: 0.05 10*3/uL (ref 0.00–0.07)
Basophils Absolute: 0.1 10*3/uL (ref 0.0–0.1)
Basophils Relative: 1 %
Eosinophils Absolute: 0.1 10*3/uL (ref 0.0–0.5)
Eosinophils Relative: 2 %
HCT: 34.1 % — ABNORMAL LOW (ref 36.0–46.0)
Hemoglobin: 11.8 g/dL — ABNORMAL LOW (ref 12.0–15.0)
Immature Granulocytes: 1 %
Lymphocytes Relative: 36 %
Lymphs Abs: 2.7 10*3/uL (ref 0.7–4.0)
MCH: 29 pg (ref 26.0–34.0)
MCHC: 34.6 g/dL (ref 30.0–36.0)
MCV: 83.8 fL (ref 80.0–100.0)
Monocytes Absolute: 0.4 10*3/uL (ref 0.1–1.0)
Monocytes Relative: 5 %
Neutro Abs: 4.2 10*3/uL (ref 1.7–7.7)
Neutrophils Relative %: 55 %
Platelets: 261 10*3/uL (ref 150–400)
RBC: 4.07 MIL/uL (ref 3.87–5.11)
RDW: 12.2 % (ref 11.5–15.5)
WBC: 7.5 10*3/uL (ref 4.0–10.5)
nRBC: 0 % (ref 0.0–0.2)

## 2022-02-21 NOTE — Progress Notes (Signed)
[place] OFFICE PROGRESS NOTE  Patient Care Team: Faustino Congress, NP as PCP - General (Family Medicine) Heath Lark, MD as Consulting Physician (Hematology and Oncology) Lavonna Monarch, MD as Consulting Physician (Dermatology) Salvadore Dom, MD as Consulting Physician (Obstetrics and Gynecology)  ASSESSMENT & PLAN:  CLL (chronic lymphocytic leukemia) [facility]) She has no detectable abnormal lymphocytosis Examination is benign We discussed the signs and symptoms to watch out for We discussed importance of annual influenza vaccination I will see her back in a year for further follow-up   No orders of the defined types were placed in this encounter.   All questions were answered. The patient knows to call the clinic with any problems, questions or concerns. The total time spent in the appointment was 20 minutes encounter with patients including review of chart and various tests results, discussions about plan of care and coordination of care plan   Heath Lark, MD 02/21/2022 9:05 AM  INTERVAL HISTORY: Please see below for problem oriented charting. she returns for surveillance follow-up for diagnosis of CLL She is doing well No new lymphadenopathy She had recent upper respiratory tract infection that is self resolving Denies abnormal night sweats or weight loss  REVIEW OF SYSTEMS:   Constitutional: Denies fevers, chills or abnormal weight loss Eyes: Denies blurriness of vision Ears, nose, mouth, throat, and face: Denies mucositis or sore throat Respiratory: Denies cough, dyspnea or wheezes Cardiovascular: Denies palpitation, chest discomfort or lower extremity swelling Gastrointestinal:  Denies nausea, heartburn or change in bowel habits Skin: Denies abnormal skin rashes Lymphatics: Denies new lymphadenopathy or easy bruising Neurological:Denies numbness, tingling or new weaknesses Behavioral/Psych: Mood is stable, no new changes  All other systems  were reviewed with the patient and are negative.  I have reviewed the past medical history, past surgical history, social history and family history with the patient and they are unchanged from previous note.  ALLERGIES:  is allergic to contrast media [iodinated contrast media], amoxicillin, influenza virus vac live quad, lipitor [atorvastatin], other, provera [medroxyprogesterone acetate], sulfa antibiotics, and tetanus toxoids.  MEDICATIONS:  Current Outpatient Medications  Medication Sig Dispense Refill   Acetaminophen (TYLENOL PO) Take by mouth.     cholecalciferol (VITAMIN D3) 25 MCG (1000 UNIT) tablet Take 2,000 Units by mouth daily.     Estradiol 10 MCG TABS vaginal tablet INSERT 1 TABLET VAGINALLY TWICE A WEEK. 24 tablet 3   hydrochlorothiazide (HYDRODIURIL) 25 MG tablet Take 0.5 tablets by mouth daily.     ibuprofen (ADVIL) 200 MG tablet      Polyethyl Glycol-Propyl Glycol (SYSTANE PRESERVATIVE FREE OP) Apply to eye.     No current facility-administered medications for this visit.    SUMMARY OF ONCOLOGIC HISTORY: Oncology History  CLL (chronic lymphocytic leukemia) ([place])  02/24/2004 Pathology Results    Case #: [id number] confirmed CLL   01/23/2017 Pathology Results   Peripheral Blood Flow Cytometry - CD5-POSITIVE MONOCLONAL B CELL POPULATION MOST CONSISTENT WITH CHRONIC LYMPHOCYTIC LEUKEMIA - SEE COMMENT Diagnosis Comment: Examination of the peripheral blood reveals a lymphocytosis with frequent small lymphocytes that have clumped chromatin and scant cytoplasm . Larger cells with prominent nucleoli are not identified. By flow cytometry, a B-cell population that expresses CD5, CD19, CD20, CD21, CD22, HLA-DR and CD23 comprises 46% of all lymphocytes. Overall, the morphology and immunophenotype are consistent with the patient's history of chronic lymphocytic leukemia   01/23/2017 Pathology Results   FISh confirmed deletion 13 q    01/23/2018 Cancer Staging   Staging  form: Chronic  Lymphocytic Leukemia / Small Lymphocytic Lymphoma, AJCC 8th Edition - Clinical: Modified Rai Stage 0 (Modified Rai risk: Low, Lymphocytosis: Present, Adenopathy: Absent, Organomegaly: Absent, Anemia: Absent, Thrombocytopenia: Absent) - Signed by Heath Lark, MD on 01/23/2018     PHYSICAL EXAMINATION: ECOG PERFORMANCE STATUS: 0 - Asymptomatic  Vitals:   02/21/22 0828  BP: (!) 139/56  Pulse: 74  Resp: 18  Temp: 97.7 F (36.5 C)  SpO2: 100%   Filed Weights   02/21/22 0828  Weight: 143 lb (64.9 kg)    GENERAL:alert, no distress and comfortable NEURO: alert & oriented x 3 with fluent speech, no focal motor/sensory deficits  LABORATORY DATA:  I have reviewed the data as listed    Component Value Date/Time   NA 141 05/04/2018 1249   NA 139 08/25/2014 1309   K 4.0 05/04/2018 1249   K 3.8 08/25/2014 1309   CL 106 05/04/2018 1249   CL 102 08/29/2012 0934   CO2 28 05/04/2018 1249   CO2 23 08/25/2014 1309   GLUCOSE 92 05/04/2018 1249   GLUCOSE 99 08/25/2014 1309   GLUCOSE 100 (H) 08/29/2012 0934   BUN 13 05/04/2018 1249   BUN 13.8 08/25/2014 1309   CREATININE 0.72 05/04/2018 1249   CREATININE 0.6 08/25/2014 1309   CALCIUM 9.8 05/04/2018 1249   CALCIUM 9.8 08/25/2014 1309   PROT 6.8 05/04/2018 1249   PROT 6.8 08/25/2014 1309   ALBUMIN 4.2 05/04/2018 1249   ALBUMIN 4.4 08/25/2014 1309   AST 19 05/04/2018 1249   AST 18 08/25/2014 1309   ALT 20 05/04/2018 1249   ALT 21 08/25/2014 1309   ALKPHOS 108 05/04/2018 1249   ALKPHOS 111 08/25/2014 1309   BILITOT 0.6 05/04/2018 1249   BILITOT 0.54 08/25/2014 1309   GFRNONAA >60 05/04/2018 1249   GFRAA >60 05/04/2018 1249    No results found for: "SPEP", "UPEP"  Lab Results  Component Value Date   WBC 7.5 02/21/2022   NEUTROABS 4.2 02/21/2022   HGB 11.8 (L) 02/21/2022   HCT 34.1 (L) 02/21/2022   MCV 83.8 02/21/2022   PLT 261 02/21/2022      Chemistry      Component Value Date/Time   NA 141 05/04/2018 1249   NA 139  08/25/2014 1309   K 4.0 05/04/2018 1249   K 3.8 08/25/2014 1309   CL 106 05/04/2018 1249   CL 102 08/29/2012 0934   CO2 28 05/04/2018 1249   CO2 23 08/25/2014 1309   BUN 13 05/04/2018 1249   BUN 13.8 08/25/2014 1309   CREATININE 0.72 05/04/2018 1249   CREATININE 0.6 08/25/2014 1309      Component Value Date/Time   CALCIUM 9.8 05/04/2018 1249   CALCIUM 9.8 08/25/2014 1309   ALKPHOS 108 05/04/2018 1249   ALKPHOS 111 08/25/2014 1309   AST 19 05/04/2018 1249   AST 18 08/25/2014 1309   ALT 20 05/04/2018 1249   ALT 21 08/25/2014 1309   BILITOT 0.6 05/04/2018 1249   BILITOT 0.54 08/25/2014 1309

## 2022-02-21 NOTE — Assessment & Plan Note (Signed)
She has no detectable abnormal lymphocytosis Examination is benign We discussed the signs and symptoms to watch out for We discussed importance of annual influenza vaccination I will see her back in a year for further follow-up

## 2022-02-22 ENCOUNTER — Other Ambulatory Visit: Payer: Self-pay | Admitting: Family Medicine

## 2022-02-22 DIAGNOSIS — M810 Age-related osteoporosis without current pathological fracture: Secondary | ICD-10-CM

## 2022-02-28 NOTE — Telephone Encounter (Signed)
[facility] called patient but she was not ready to schedule and said she will call back when ready.

## 2022-03-14 ENCOUNTER — Ambulatory Visit
Admission: RE | Admit: 2022-03-14 | Discharge: 2022-03-14 | Disposition: A | Discharge status: Home / Self Care | Payer: PPO | Source: Ambulatory Visit | Attending: Family Medicine | Admitting: Family Medicine

## 2022-03-14 DIAGNOSIS — Z1231 Encounter for screening mammogram for malignant neoplasm of breast: Secondary | ICD-10-CM

## 2022-03-16 ENCOUNTER — Other Ambulatory Visit: Payer: Self-pay | Admitting: Family Medicine

## 2022-03-16 DIAGNOSIS — R928 Other abnormal and inconclusive findings on diagnostic imaging of breast: Secondary | ICD-10-CM

## 2022-03-17 DIAGNOSIS — N3946 Mixed incontinence: Secondary | ICD-10-CM | POA: Diagnosis not present

## 2022-03-17 DIAGNOSIS — M6281 Muscle weakness (generalized): Secondary | ICD-10-CM | POA: Diagnosis not present

## 2022-03-17 DIAGNOSIS — M6289 Other specified disorders of muscle: Secondary | ICD-10-CM | POA: Diagnosis not present

## 2022-03-24 ENCOUNTER — Ambulatory Visit: Payer: PPO

## 2022-03-24 ENCOUNTER — Ambulatory Visit
Admission: RE | Admit: 2022-03-24 | Discharge: 2022-03-24 | Disposition: A | Discharge status: Home / Self Care | Payer: PPO | Source: Ambulatory Visit | Attending: Family Medicine | Admitting: Family Medicine

## 2022-03-24 DIAGNOSIS — R928 Other abnormal and inconclusive findings on diagnostic imaging of breast: Secondary | ICD-10-CM

## 2022-03-31 DIAGNOSIS — M6281 Muscle weakness (generalized): Secondary | ICD-10-CM | POA: Diagnosis not present

## 2022-03-31 DIAGNOSIS — M6289 Other specified disorders of muscle: Secondary | ICD-10-CM | POA: Diagnosis not present

## 2022-03-31 DIAGNOSIS — M62838 Other muscle spasm: Secondary | ICD-10-CM | POA: Diagnosis not present

## 2022-03-31 DIAGNOSIS — N3946 Mixed incontinence: Secondary | ICD-10-CM | POA: Diagnosis not present

## 2022-04-21 DIAGNOSIS — M62838 Other muscle spasm: Secondary | ICD-10-CM | POA: Diagnosis not present

## 2022-04-21 DIAGNOSIS — M6289 Other specified disorders of muscle: Secondary | ICD-10-CM | POA: Diagnosis not present

## 2022-04-21 DIAGNOSIS — N3946 Mixed incontinence: Secondary | ICD-10-CM | POA: Diagnosis not present

## 2022-04-21 DIAGNOSIS — M6281 Muscle weakness (generalized): Secondary | ICD-10-CM | POA: Diagnosis not present

## 2022-04-22 DIAGNOSIS — H18593 Other hereditary corneal dystrophies, bilateral: Secondary | ICD-10-CM | POA: Diagnosis not present

## 2022-04-22 DIAGNOSIS — H3554 Dystrophies primarily involving the retinal pigment epithelium: Secondary | ICD-10-CM | POA: Diagnosis not present

## 2022-04-22 DIAGNOSIS — H524 Presbyopia: Secondary | ICD-10-CM | POA: Diagnosis not present

## 2022-04-22 DIAGNOSIS — H04123 Dry eye syndrome of bilateral lacrimal glands: Secondary | ICD-10-CM | POA: Diagnosis not present

## 2022-04-22 DIAGNOSIS — Z961 Presence of intraocular lens: Secondary | ICD-10-CM | POA: Diagnosis not present

## 2022-04-29 ENCOUNTER — Telehealth: Payer: Self-pay

## 2022-04-29 NOTE — Telephone Encounter (Signed)
Returned her call about flu shot. Told her Dr. Alvy Bimler recommends everyone get the flu shot and wait 1 month 1 get covid vaccine if she wants it. She verbalized understanding.

## 2022-05-25 DIAGNOSIS — J310 Chronic rhinitis: Secondary | ICD-10-CM | POA: Diagnosis not present

## 2022-05-25 DIAGNOSIS — R0982 Postnasal drip: Secondary | ICD-10-CM | POA: Diagnosis not present

## 2022-05-25 DIAGNOSIS — H9041 Sensorineural hearing loss, unilateral, right ear, with unrestricted hearing on the contralateral side: Secondary | ICD-10-CM | POA: Diagnosis not present

## 2022-05-25 DIAGNOSIS — J343 Hypertrophy of nasal turbinates: Secondary | ICD-10-CM | POA: Diagnosis not present

## 2022-06-02 DIAGNOSIS — M6281 Muscle weakness (generalized): Secondary | ICD-10-CM | POA: Diagnosis not present

## 2022-06-02 DIAGNOSIS — M6289 Other specified disorders of muscle: Secondary | ICD-10-CM | POA: Diagnosis not present

## 2022-06-02 DIAGNOSIS — N3946 Mixed incontinence: Secondary | ICD-10-CM | POA: Diagnosis not present

## 2022-06-02 DIAGNOSIS — M62838 Other muscle spasm: Secondary | ICD-10-CM | POA: Diagnosis not present

## 2022-06-03 DIAGNOSIS — Z6823 Body mass index (BMI) 23.0-23.9, adult: Secondary | ICD-10-CM | POA: Diagnosis not present

## 2022-06-03 DIAGNOSIS — R059 Cough, unspecified: Secondary | ICD-10-CM | POA: Diagnosis not present

## 2022-06-03 DIAGNOSIS — J0140 Acute pansinusitis, unspecified: Secondary | ICD-10-CM | POA: Diagnosis not present

## 2022-06-03 DIAGNOSIS — J069 Acute upper respiratory infection, unspecified: Secondary | ICD-10-CM | POA: Diagnosis not present

## 2022-07-11 DIAGNOSIS — Z800 Family history of malignant neoplasm of digestive organs: Secondary | ICD-10-CM | POA: Diagnosis not present

## 2022-07-11 DIAGNOSIS — Z1211 Encounter for screening for malignant neoplasm of colon: Secondary | ICD-10-CM | POA: Diagnosis not present

## 2022-07-11 DIAGNOSIS — K648 Other hemorrhoids: Secondary | ICD-10-CM | POA: Diagnosis not present

## 2022-07-11 DIAGNOSIS — K5730 Diverticulosis of large intestine without perforation or abscess without bleeding: Secondary | ICD-10-CM | POA: Diagnosis not present

## 2022-07-11 DIAGNOSIS — Z86010 Personal history of colonic polyps: Secondary | ICD-10-CM | POA: Diagnosis not present

## 2022-07-13 DIAGNOSIS — H9041 Sensorineural hearing loss, unilateral, right ear, with unrestricted hearing on the contralateral side: Secondary | ICD-10-CM | POA: Diagnosis not present

## 2022-07-13 DIAGNOSIS — J310 Chronic rhinitis: Secondary | ICD-10-CM | POA: Diagnosis not present

## 2022-07-13 DIAGNOSIS — R0982 Postnasal drip: Secondary | ICD-10-CM | POA: Diagnosis not present

## 2022-07-13 DIAGNOSIS — J343 Hypertrophy of nasal turbinates: Secondary | ICD-10-CM | POA: Diagnosis not present

## 2022-08-24 ENCOUNTER — Ambulatory Visit
Admission: RE | Admit: 2022-08-24 | Discharge: 2022-08-24 | Disposition: A | Discharge status: Home / Self Care | Payer: PPO | Source: Ambulatory Visit | Attending: Family Medicine | Admitting: Family Medicine

## 2022-08-24 DIAGNOSIS — M810 Age-related osteoporosis without current pathological fracture: Secondary | ICD-10-CM | POA: Diagnosis not present

## 2022-08-24 DIAGNOSIS — M8588 Other specified disorders of bone density and structure, other site: Secondary | ICD-10-CM | POA: Diagnosis not present

## 2022-08-24 DIAGNOSIS — Z780 Asymptomatic menopausal state: Secondary | ICD-10-CM | POA: Diagnosis not present

## 2022-09-19 DIAGNOSIS — M810 Age-related osteoporosis without current pathological fracture: Secondary | ICD-10-CM | POA: Diagnosis not present

## 2022-09-19 DIAGNOSIS — M79602 Pain in left arm: Secondary | ICD-10-CM | POA: Diagnosis not present

## 2022-09-19 DIAGNOSIS — Z6823 Body mass index (BMI) 23.0-23.9, adult: Secondary | ICD-10-CM | POA: Diagnosis not present

## 2022-09-19 DIAGNOSIS — I10 Essential (primary) hypertension: Secondary | ICD-10-CM | POA: Diagnosis not present

## 2022-09-29 DIAGNOSIS — M79602 Pain in left arm: Secondary | ICD-10-CM | POA: Diagnosis not present

## 2022-09-29 DIAGNOSIS — M25512 Pain in left shoulder: Secondary | ICD-10-CM | POA: Diagnosis not present

## 2022-10-06 DIAGNOSIS — M25512 Pain in left shoulder: Secondary | ICD-10-CM | POA: Diagnosis not present

## 2022-10-06 DIAGNOSIS — M79602 Pain in left arm: Secondary | ICD-10-CM | POA: Diagnosis not present

## 2022-10-12 DIAGNOSIS — M25512 Pain in left shoulder: Secondary | ICD-10-CM | POA: Diagnosis not present

## 2022-10-13 DIAGNOSIS — J310 Chronic rhinitis: Secondary | ICD-10-CM | POA: Diagnosis not present

## 2022-10-13 DIAGNOSIS — J343 Hypertrophy of nasal turbinates: Secondary | ICD-10-CM | POA: Diagnosis not present

## 2022-10-17 DIAGNOSIS — M25512 Pain in left shoulder: Secondary | ICD-10-CM | POA: Diagnosis not present

## 2022-10-19 DIAGNOSIS — M25512 Pain in left shoulder: Secondary | ICD-10-CM | POA: Diagnosis not present

## 2022-10-24 DIAGNOSIS — M25512 Pain in left shoulder: Secondary | ICD-10-CM | POA: Diagnosis not present

## 2022-10-26 DIAGNOSIS — M25512 Pain in left shoulder: Secondary | ICD-10-CM | POA: Diagnosis not present

## 2022-11-02 DIAGNOSIS — M25512 Pain in left shoulder: Secondary | ICD-10-CM | POA: Diagnosis not present

## 2022-11-04 DIAGNOSIS — M79602 Pain in left arm: Secondary | ICD-10-CM | POA: Diagnosis not present

## 2022-11-04 DIAGNOSIS — M25512 Pain in left shoulder: Secondary | ICD-10-CM | POA: Diagnosis not present

## 2022-11-07 DIAGNOSIS — M79602 Pain in left arm: Secondary | ICD-10-CM | POA: Diagnosis not present

## 2022-11-07 DIAGNOSIS — M25512 Pain in left shoulder: Secondary | ICD-10-CM | POA: Diagnosis not present

## 2022-11-09 DIAGNOSIS — M25512 Pain in left shoulder: Secondary | ICD-10-CM | POA: Diagnosis not present

## 2022-11-09 DIAGNOSIS — M79602 Pain in left arm: Secondary | ICD-10-CM | POA: Diagnosis not present

## 2022-12-05 ENCOUNTER — Ambulatory Visit: Payer: PPO | Admitting: Podiatry

## 2022-12-08 ENCOUNTER — Ambulatory Visit (INDEPENDENT_AMBULATORY_CARE_PROVIDER_SITE_OTHER): Payer: PPO

## 2022-12-08 ENCOUNTER — Ambulatory Visit: Payer: PPO | Admitting: Podiatry

## 2022-12-08 DIAGNOSIS — R2681 Unsteadiness on feet: Secondary | ICD-10-CM | POA: Diagnosis not present

## 2022-12-08 DIAGNOSIS — G8929 Other chronic pain: Secondary | ICD-10-CM | POA: Diagnosis not present

## 2022-12-08 DIAGNOSIS — M2041 Other hammer toe(s) (acquired), right foot: Secondary | ICD-10-CM

## 2022-12-08 DIAGNOSIS — M79671 Pain in right foot: Secondary | ICD-10-CM | POA: Diagnosis not present

## 2022-12-08 DIAGNOSIS — M79672 Pain in left foot: Secondary | ICD-10-CM

## 2022-12-08 DIAGNOSIS — M19079 Primary osteoarthritis, unspecified ankle and foot: Secondary | ICD-10-CM

## 2022-12-08 NOTE — Patient Instructions (Signed)
Look at an extra depth shoe or double depth shoe

## 2022-12-08 NOTE — Progress Notes (Signed)
Subjective:   Patient ID: Audrey Sweeney, female   DOB: 77 y.o.   MRN: [id number]   HPI Chief Complaint  Patient presents with   Hammer Toe    Hammer toe on right foot, patient is interested in orthotics if it will help with ambuation   Foot Pain    Patient is complaining of achiness and swelling to bilateral feet to the top of the feet. Patient stated when she walks her feet turn inwards.     77 year old female presents the office for above concerns.  No recent injuries that she reports.  She tried over-the-counter inserts to help some.  She been using a "sports cream" for the foot occasionally as well as ice.   Review of Systems  All other systems reviewed and are negative.  Past Medical History:  Diagnosis Date   Abnormal MRI    Abnormal Pap smear of cervix    ?   Ankylosing spondylitis (HCC)    Arthritis    C. difficile colitis    Cervical disc disease    CLL (chronic lymphocytic leukemia) ([facility])    Rai stage 0.   Colon polyps    Depression    Hypercholesterolemia    Hypertension    IBS (irritable bowel syndrome)    Osteopenia    Osteoporosis    Rheumatoid arthritis(714.0)    Seasonal allergies    Statin intolerance     Past Surgical History:  Procedure Laterality Date   CESAREAN SECTION     COLONOSCOPY W/ POLYPECTOMY     Pre-cancerous polyps removed.   ENDOMETRIAL BIOPSY     8/98   TUBAL LIGATION       Current Outpatient Medications:    Acetaminophen (TYLENOL PO), Take by mouth., Disp: , Rfl:    cholecalciferol (VITAMIN D3) 25 MCG (1000 UNIT) tablet, Take 2,000 Units by mouth daily., Disp: , Rfl:    Estradiol 10 MCG TABS vaginal tablet, INSERT 1 TABLET VAGINALLY TWICE A WEEK., Disp: 24 tablet, Rfl: 3   hydrochlorothiazide (HYDRODIURIL) 25 MG tablet, Take 0.5 tablets by mouth daily., Disp: , Rfl:    ibuprofen (ADVIL) 200 MG tablet, , Disp: , Rfl:    Polyethyl Glycol-Propyl Glycol (SYSTANE PRESERVATIVE FREE OP), Apply to eye., Disp: , Rfl:   Allergies   Allergen Reactions   Contrast Media [Iodinated Contrast Media] Shortness Of Breath    IV Contrast allergy.   Pt has SOB even with premedication.   Amoxicillin     Yeast infection   Influenza Virus Vac Live Quad Hives    Uncertain what type of flu vaccine she had   Lipitor [Atorvastatin]     intolerant   Other     Dairy- stomach upset  Influenza vaccine types A & B PF- itching   Provera [Medroxyprogesterone Acetate]     Headache & depression   Sulfa Antibiotics     rash   Tetanus Toxoids     Sick for 5 weeks after tetanus-diphth-acell pertussis           Objective:  Physical Exam  General: AAO x3, NAD  Dermatological: Skin is warm, dry and supple bilateral. There are no open sores, no preulcerative lesions, no rash or signs of infection present.  Vascular: Dorsalis Pedis artery and Posterior Tibial artery pedal pulses are 2/4 bilateral with immedate capillary fill time. There is no pain with calf compression, swelling, warmth, erythema.   Neruologic: Grossly intact via light touch bilateral.   Musculoskeletal: Bunions present with multiple  digital contractures, hammertoes.  There is decreased medial arch height upon weightbearing.  She does get tenderness on the dorsal midfoot and Lisfranc joint.  Flexor, extensor tendons are intact.  MMT 5/5.  Gait: Unassisted, Nonantalgic.       Assessment:   77 year old female with chronic foot pain, arthritis, digital contractures     Plan:  -Treatment options discussed including all alternatives, risks, and complications -Etiology of symptoms were discussed -X-rays were obtained and reviewed with the patient.  3 views of the right foot was obtained.  No evidence of acute fracture.  Significant bunion present as well as multiple digital contractures, hammertoes.  Arthritic changes present with sclerosis noted along the Lisfranc joint. -I do think she will benefit from orthotics to help with her pain, gait.  Will submit  authorization to help to manage for this. -I do agree with Voltaren gel.  Discussed offloading for the hammertoes, bunions as well as extra-depth shoes to avoid rubbing of the toes.  Vivi Barrack DPM

## 2022-12-20 DIAGNOSIS — Z6823 Body mass index (BMI) 23.0-23.9, adult: Secondary | ICD-10-CM | POA: Diagnosis not present

## 2022-12-20 DIAGNOSIS — J019 Acute sinusitis, unspecified: Secondary | ICD-10-CM | POA: Diagnosis not present

## 2022-12-20 DIAGNOSIS — C9110 Chronic lymphocytic leukemia of B-cell type not having achieved remission: Secondary | ICD-10-CM | POA: Diagnosis not present

## 2022-12-30 ENCOUNTER — Telehealth: Payer: Self-pay | Admitting: Podiatry

## 2022-12-30 NOTE — Telephone Encounter (Signed)
Lmom for patient to call back to set up appt to pick up orthotics   Charges pending -need posted

## 2022-12-30 NOTE — Telephone Encounter (Signed)
Authorization  for orthotics     Authorization [id number] 12/14/22-03/14/23    Documentation being sent to scan center

## 2023-01-03 DIAGNOSIS — R197 Diarrhea, unspecified: Secondary | ICD-10-CM | POA: Diagnosis not present

## 2023-01-03 DIAGNOSIS — Z6823 Body mass index (BMI) 23.0-23.9, adult: Secondary | ICD-10-CM | POA: Diagnosis not present

## 2023-01-04 DIAGNOSIS — R197 Diarrhea, unspecified: Secondary | ICD-10-CM | POA: Diagnosis not present

## 2023-01-06 DIAGNOSIS — E7800 Pure hypercholesterolemia, unspecified: Secondary | ICD-10-CM | POA: Diagnosis not present

## 2023-01-06 DIAGNOSIS — C9110 Chronic lymphocytic leukemia of B-cell type not having achieved remission: Secondary | ICD-10-CM | POA: Diagnosis not present

## 2023-01-06 DIAGNOSIS — I10 Essential (primary) hypertension: Secondary | ICD-10-CM | POA: Diagnosis not present

## 2023-01-06 DIAGNOSIS — E559 Vitamin D deficiency, unspecified: Secondary | ICD-10-CM | POA: Diagnosis not present

## 2023-01-19 ENCOUNTER — Ambulatory Visit (INDEPENDENT_AMBULATORY_CARE_PROVIDER_SITE_OTHER): Payer: PPO | Admitting: Podiatry

## 2023-01-19 DIAGNOSIS — M79672 Pain in left foot: Secondary | ICD-10-CM

## 2023-01-19 DIAGNOSIS — M19079 Primary osteoarthritis, unspecified ankle and foot: Secondary | ICD-10-CM

## 2023-01-19 DIAGNOSIS — M2041 Other hammer toe(s) (acquired), right foot: Secondary | ICD-10-CM

## 2023-01-19 DIAGNOSIS — G8929 Other chronic pain: Secondary | ICD-10-CM

## 2023-01-19 DIAGNOSIS — M79671 Pain in right foot: Secondary | ICD-10-CM

## 2023-01-19 NOTE — Progress Notes (Signed)
Patient presents today to pick up custom orthotics   Patient was dispensed 1 pair of custom orthotics  Fit was satisfactory. Instructions for break-in and wear was reviewed and a copy was given to the patient.    

## 2023-01-31 ENCOUNTER — Telehealth: Payer: Self-pay | Admitting: Hematology and Oncology

## 2023-01-31 DIAGNOSIS — Z6822 Body mass index (BMI) 22.0-22.9, adult: Secondary | ICD-10-CM | POA: Diagnosis not present

## 2023-01-31 DIAGNOSIS — I10 Essential (primary) hypertension: Secondary | ICD-10-CM | POA: Diagnosis not present

## 2023-01-31 DIAGNOSIS — Z0000 Encounter for general adult medical examination without abnormal findings: Secondary | ICD-10-CM | POA: Diagnosis not present

## 2023-01-31 DIAGNOSIS — Z889 Allergy status to unspecified drugs, medicaments and biological substances status: Secondary | ICD-10-CM | POA: Diagnosis not present

## 2023-01-31 DIAGNOSIS — Z23 Encounter for immunization: Secondary | ICD-10-CM | POA: Diagnosis not present

## 2023-01-31 DIAGNOSIS — E7800 Pure hypercholesterolemia, unspecified: Secondary | ICD-10-CM | POA: Diagnosis not present

## 2023-01-31 DIAGNOSIS — M810 Age-related osteoporosis without current pathological fracture: Secondary | ICD-10-CM | POA: Diagnosis not present

## 2023-01-31 DIAGNOSIS — D649 Anemia, unspecified: Secondary | ICD-10-CM | POA: Diagnosis not present

## 2023-01-31 NOTE — Telephone Encounter (Signed)
Spoke with patient confirming upcoming appointment change  

## 2023-02-22 ENCOUNTER — Encounter: Payer: Self-pay | Admitting: Family Medicine

## 2023-02-23 ENCOUNTER — Other Ambulatory Visit: Payer: PPO

## 2023-02-23 ENCOUNTER — Ambulatory Visit: Payer: PPO | Admitting: Hematology and Oncology

## 2023-02-23 ENCOUNTER — Other Ambulatory Visit: Payer: Self-pay | Admitting: Radiology

## 2023-02-23 DIAGNOSIS — Z1231 Encounter for screening mammogram for malignant neoplasm of breast: Secondary | ICD-10-CM

## 2023-02-28 ENCOUNTER — Inpatient Hospital Stay: Payer: PPO | Admitting: Hematology and Oncology

## 2023-02-28 ENCOUNTER — Inpatient Hospital Stay: Payer: PPO | Attending: Hematology and Oncology

## 2023-02-28 ENCOUNTER — Other Ambulatory Visit: Payer: Self-pay

## 2023-02-28 ENCOUNTER — Encounter: Payer: Self-pay | Admitting: Hematology and Oncology

## 2023-02-28 VITALS — BP 141/63 | HR 83 | Temp 97.7°F | Resp 18 | Ht 66.0 in | Wt 139.8 lb

## 2023-02-28 DIAGNOSIS — Z882 Allergy status to sulfonamides status: Secondary | ICD-10-CM | POA: Insufficient documentation

## 2023-02-28 DIAGNOSIS — D801 Nonfamilial hypogammaglobulinemia: Secondary | ICD-10-CM | POA: Insufficient documentation

## 2023-02-28 DIAGNOSIS — Z887 Allergy status to serum and vaccine status: Secondary | ICD-10-CM | POA: Insufficient documentation

## 2023-02-28 DIAGNOSIS — Z79899 Other long term (current) drug therapy: Secondary | ICD-10-CM | POA: Diagnosis not present

## 2023-02-28 DIAGNOSIS — C9110 Chronic lymphocytic leukemia of B-cell type not having achieved remission: Secondary | ICD-10-CM | POA: Insufficient documentation

## 2023-02-28 DIAGNOSIS — Z880 Allergy status to penicillin: Secondary | ICD-10-CM | POA: Diagnosis not present

## 2023-02-28 LAB — CBC WITH DIFFERENTIAL/PLATELET
Abs Immature Granulocytes: 0.02 10*3/uL (ref 0.00–0.07)
Basophils Absolute: 0 10*3/uL (ref 0.0–0.1)
Basophils Relative: 1 %
Eosinophils Absolute: 0.1 10*3/uL (ref 0.0–0.5)
Eosinophils Relative: 2 %
HCT: 35.2 % — ABNORMAL LOW (ref 36.0–46.0)
Hemoglobin: 12.2 g/dL (ref 12.0–15.0)
Immature Granulocytes: 0 %
Lymphocytes Relative: 41 %
Lymphs Abs: 2.3 10*3/uL (ref 0.7–4.0)
MCH: 28.6 pg (ref 26.0–34.0)
MCHC: 34.7 g/dL (ref 30.0–36.0)
MCV: 82.4 fL (ref 80.0–100.0)
Monocytes Absolute: 0.4 10*3/uL (ref 0.1–1.0)
Monocytes Relative: 7 %
Neutro Abs: 2.8 10*3/uL (ref 1.7–7.7)
Neutrophils Relative %: 49 %
Platelets: 258 10*3/uL (ref 150–400)
RBC: 4.27 MIL/uL (ref 3.87–5.11)
RDW: 12.8 % (ref 11.5–15.5)
WBC: 5.7 10*3/uL (ref 4.0–10.5)
nRBC: 0 % (ref 0.0–0.2)

## 2023-02-28 NOTE — Assessment & Plan Note (Signed)
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

## 2023-02-28 NOTE — Progress Notes (Signed)
[facility] OFFICE PROGRESS NOTE  Patient Care Team: Moshe Cipro, NP as PCP - General (Family Medicine) Artis Delay, MD as Consulting Physician (Hematology and Oncology) Janalyn Harder, MD (Inactive) as Consulting Physician (Dermatology) Romualdo Bolk, MD (Inactive) as Consulting Physician (Obstetrics and Gynecology)  ASSESSMENT & PLAN:  CLL (chronic lymphocytic leukemia) [facility]) She has no signs of disease progression from CLL perspective but have recurrent infection due to secondary acquired hypogammaglobulinemia We discussed risk and benefits of IVIG treatment I will see her again in September for further follow-up  Acquired hypogammaglobulinemia [facility]) She is known to have acquired hypogammaglobulinemia We discussed risk and benefits of IVIG infusion to reduce risk of recurrent infection in the future She would like to think about it and do some research Plan to see her in September for further follow-up  Orders Placed This Encounter  Procedures   IgG, IgA, IgM    Standing Status:   Future    Standing Expiration Date:   02/28/2024    All questions were answered. The patient knows to call the clinic with any problems, questions or concerns. The total time spent in the appointment was 20 minutes encounter with patients including review of chart and various tests results, discussions about plan of care and coordination of care plan   Artis Delay, MD 02/28/2023 11:42 AM  INTERVAL HISTORY: Please see below for problem oriented charting. she returns for surveillance follow-up for CLL The patient had recurrent sinus infection this year leading to diagnosis of C. difficile infection in June She is fully recovered now We discussed her prior blood test results and the role of IVIG treatment   REVIEW OF SYSTEMS:   Constitutional: Denies fevers, chills or abnormal weight loss Eyes: Denies blurriness of vision Ears, nose, mouth, throat, and face: Denies  mucositis or sore throat Respiratory: Denies cough, dyspnea or wheezes Cardiovascular: Denies palpitation, chest discomfort or lower extremity swelling Gastrointestinal:  Denies nausea, heartburn or change in bowel habits Skin: Denies abnormal skin rashes Lymphatics: Denies new lymphadenopathy or easy bruising Neurological:Denies numbness, tingling or new weaknesses Behavioral/Psych: Mood is stable, no new changes  All other systems were reviewed with the patient and are negative.  I have reviewed the past medical history, past surgical history, social history and family history with the patient and they are unchanged from previous note.  ALLERGIES:  is allergic to contrast media [iodinated contrast media], amoxicillin, influenza virus vac live quad, lipitor [atorvastatin], other, provera [medroxyprogesterone acetate], sulfa antibiotics, and tetanus toxoids.  MEDICATIONS:  Current Outpatient Medications  Medication Sig Dispense Refill   Acetaminophen (TYLENOL PO) Take by mouth.     cholecalciferol (VITAMIN D3) 25 MCG (1000 UNIT) tablet Take 2,000 Units by mouth daily.     Estradiol 10 MCG TABS vaginal tablet INSERT 1 TABLET VAGINALLY TWICE A WEEK. 24 tablet 3   hydrochlorothiazide (HYDRODIURIL) 25 MG tablet Take 0.5 tablets by mouth daily.     ibuprofen (ADVIL) 200 MG tablet      Polyethyl Glycol-Propyl Glycol (SYSTANE PRESERVATIVE FREE OP) Apply to eye.     No current facility-administered medications for this visit.    SUMMARY OF ONCOLOGIC HISTORY: Oncology History  CLL (chronic lymphocytic leukemia) (HCC)  02/24/2004 Pathology Results    Case #: [id number] confirmed CLL   01/23/2017 Pathology Results   Peripheral Blood Flow Cytometry - CD5-POSITIVE MONOCLONAL B CELL POPULATION MOST CONSISTENT WITH CHRONIC LYMPHOCYTIC LEUKEMIA - SEE COMMENT Diagnosis Comment: Examination of the peripheral blood reveals a lymphocytosis with  frequent small lymphocytes that have clumped chromatin and  scant cytoplasm . Larger cells with prominent nucleoli are not identified. By flow cytometry, a B-cell population that expresses CD5, CD19, CD20, CD21, CD22, HLA-DR and CD23 comprises 46% of all lymphocytes. Overall, the morphology and immunophenotype are consistent with the patient's history of chronic lymphocytic leukemia   01/23/2017 Pathology Results   FISh confirmed deletion 13 q    01/23/2018 Cancer Staging   Staging form: Chronic Lymphocytic Leukemia / Small Lymphocytic Lymphoma, AJCC 8th Edition - Clinical: Modified Rai Stage 0 (Modified Rai risk: Low, Lymphocytosis: Present, Adenopathy: Absent, Organomegaly: Absent, Anemia: Absent, Thrombocytopenia: Absent) - Signed by Artis Delay, MD on 01/23/2018     PHYSICAL EXAMINATION: ECOG PERFORMANCE STATUS: 0 - Asymptomatic  Vitals:   02/28/23 1114  BP: (!) 141/63  Pulse: 83  Resp: 18  Temp: 97.7 F (36.5 C)  SpO2: 100%   Filed Weights   02/28/23 1114  Weight: 139 lb 12.8 oz (63.4 kg)    GENERAL:alert, no distress and comfortable NEURO: alert & oriented x 3 with fluent speech, no focal motor/sensory deficits  LABORATORY DATA:  I have reviewed the data as listed    Component Value Date/Time   NA 141 05/04/2018 1249   NA 139 08/25/2014 1309   K 4.0 05/04/2018 1249   K 3.8 08/25/2014 1309   CL 106 05/04/2018 1249   CL 102 08/29/2012 0934   CO2 28 05/04/2018 1249   CO2 23 08/25/2014 1309   GLUCOSE 92 05/04/2018 1249   GLUCOSE 99 08/25/2014 1309   GLUCOSE 100 (H) 08/29/2012 0934   BUN 13 05/04/2018 1249   BUN 13.8 08/25/2014 1309   CREATININE 0.72 05/04/2018 1249   CREATININE 0.6 08/25/2014 1309   CALCIUM 9.8 05/04/2018 1249   CALCIUM 9.8 08/25/2014 1309   PROT 6.8 05/04/2018 1249   PROT 6.8 08/25/2014 1309   ALBUMIN 4.2 05/04/2018 1249   ALBUMIN 4.4 08/25/2014 1309   AST 19 05/04/2018 1249   AST 18 08/25/2014 1309   ALT 20 05/04/2018 1249   ALT 21 08/25/2014 1309   ALKPHOS 108 05/04/2018 1249   ALKPHOS 111  08/25/2014 1309   BILITOT 0.6 05/04/2018 1249   BILITOT 0.54 08/25/2014 1309   GFRNONAA >60 05/04/2018 1249   GFRAA >60 05/04/2018 1249    No results found for: "SPEP", "UPEP"  Lab Results  Component Value Date   WBC 5.7 02/28/2023   NEUTROABS 2.8 02/28/2023   HGB 12.2 02/28/2023   HCT 35.2 (L) 02/28/2023   MCV 82.4 02/28/2023   PLT 258 02/28/2023      Chemistry      Component Value Date/Time   NA 141 05/04/2018 1249   NA 139 08/25/2014 1309   K 4.0 05/04/2018 1249   K 3.8 08/25/2014 1309   CL 106 05/04/2018 1249   CL 102 08/29/2012 0934   CO2 28 05/04/2018 1249   CO2 23 08/25/2014 1309   BUN 13 05/04/2018 1249   BUN 13.8 08/25/2014 1309   CREATININE 0.72 05/04/2018 1249   CREATININE 0.6 08/25/2014 1309      Component Value Date/Time   CALCIUM 9.8 05/04/2018 1249   CALCIUM 9.8 08/25/2014 1309   ALKPHOS 108 05/04/2018 1249   ALKPHOS 111 08/25/2014 1309   AST 19 05/04/2018 1249   AST 18 08/25/2014 1309   ALT 20 05/04/2018 1249   ALT 21 08/25/2014 1309   BILITOT 0.6 05/04/2018 1249   BILITOT 0.54 08/25/2014 1309

## 2023-02-28 NOTE — Assessment & Plan Note (Signed)
She is known to have acquired hypogammaglobulinemia We discussed risk and benefits of IVIG infusion to reduce risk of recurrent infection in the future She would like to think about it and do some research Plan to see her in September for further follow-up

## 2023-03-02 ENCOUNTER — Ambulatory Visit: Payer: PPO | Admitting: Radiology

## 2023-03-07 DIAGNOSIS — Z6822 Body mass index (BMI) 22.0-22.9, adult: Secondary | ICD-10-CM | POA: Diagnosis not present

## 2023-03-07 DIAGNOSIS — C9110 Chronic lymphocytic leukemia of B-cell type not having achieved remission: Secondary | ICD-10-CM | POA: Diagnosis not present

## 2023-03-07 DIAGNOSIS — B999 Unspecified infectious disease: Secondary | ICD-10-CM | POA: Diagnosis not present

## 2023-03-17 ENCOUNTER — Ambulatory Visit
Admission: RE | Admit: 2023-03-17 | Discharge: 2023-03-17 | Disposition: A | Discharge status: Home / Self Care | Payer: PPO | Source: Ambulatory Visit | Attending: Radiology | Admitting: Radiology

## 2023-03-17 DIAGNOSIS — Z1231 Encounter for screening mammogram for malignant neoplasm of breast: Secondary | ICD-10-CM

## 2023-03-30 ENCOUNTER — Telehealth: Payer: Self-pay | Admitting: *Deleted

## 2023-03-30 ENCOUNTER — Encounter: Payer: Self-pay | Admitting: Radiology

## 2023-03-30 ENCOUNTER — Ambulatory Visit (INDEPENDENT_AMBULATORY_CARE_PROVIDER_SITE_OTHER): Payer: PPO | Admitting: Radiology

## 2023-03-30 VITALS — BP 122/68 | Ht 64.25 in | Wt 138.0 lb

## 2023-03-30 DIAGNOSIS — N952 Postmenopausal atrophic vaginitis: Secondary | ICD-10-CM

## 2023-03-30 DIAGNOSIS — M810 Age-related osteoporosis without current pathological fracture: Secondary | ICD-10-CM

## 2023-03-30 DIAGNOSIS — Z01419 Encounter for gynecological examination (general) (routine) without abnormal findings: Secondary | ICD-10-CM | POA: Diagnosis not present

## 2023-03-30 MED ORDER — ESTRADIOL 10 MCG VA TABS: ORAL_TABLET | VAGINAL | 3 refills | Status: DC

## 2023-03-30 NOTE — Telephone Encounter (Signed)
Routing to [employer].

## 2023-03-30 NOTE — Progress Notes (Signed)
Audrey Sweeney 02-Oct-1945 [id number]   History: Postmenopausal 77 y.o. presents for annual exam. HX of CLL, considering an infusion next month for treatment. Uses vagifem 2x/month as she forget to use it. Doing well otherwise, still working with a Forensic scientist 20hrs a week.   Gynecologic History Postmenopausal Last Pap: 2020. Results were: normal Last mammogram: 03/17/23. Results were: normal Last colonoscopy: 2023 [id number] Worsening osteoporosis   Obstetric History OB History  Gravida Para Term Preterm AB Living  3 3       3   SAB IAB Ectopic Multiple Live Births        1 4    # Outcome Date GA Lbr Len/2nd Weight Sex Type Anes PTL Lv  3 Para     M Vag-Spont   LIV  2 Para     M Vag-Spont   LIV  1A Para     F CS-Classical   DEC  1B Para     F CS-Classical   LIV    Obstetric Comments  1 child deceased     The following portions of the patient's history were reviewed and updated as appropriate: allergies, current medications, past family history, past medical history, past social history, past surgical history, and problem list.  Review of Systems Pertinent items noted in HPI and remainder of comprehensive ROS otherwise negative.  Past medical history, past surgical history, family history and social history were all reviewed and documented in the EPIC chart.  Narrative & Impression  EXAM: DUAL X-RAY ABSORPTIOMETRY (DXA) FOR BONE MINERAL DENSITY   IMPRESSION: Referring Physician:  Judeth Cornfield MATTHEWS Your patient completed a bone mineral density test using GE Lunar iDXA system (analysis version: 16). Technologist: lmn PATIENT: Name: Audrey, Sweeney Patient ID: [id number] Birth Date: [birth year] Height: 64.8 in. Sex: Female Measured: 08/24/2022 Weight: 144.6 lbs. Indications: Advanced Age, Caucasian, Estrogen Deficient, Postmenopausal Fractures: Wrist Treatments: Vitamin D (E933.5)   ASSESSMENT: The BMD measured at Femur Total Left is 0.636 g/cm2 with a  T-score of -3.0. This patient is considered osteoporotic according to World Health Organization [facility]) criteria.   The quality of the exam is good. L3 and L4 were excluded due to degenerative changes. Site Region Measured Date Measured Age YA BMD Significant CHANGE T-score DualFemur Total Left 8/24/2022    76.1         -3.0    0.636 g/cm2 DualFemur Total Left 03/27/2019    72.7         -2.7    0.663 g/cm2   AP Spine  L1-L2      08/24/2022    76.1         -2.3    0.890 g/cm2 AP Spine  L1-L2      03/27/2019    72.7         -2.1    0.913 g/cm2   DualFemur Total Mean 08/24/2022    76.1         -2.8    0.658 g/cm2 DualFemur Total Mean 03/27/2019    72.7         -3.0    0.633 g/cm2   World Health Organization [facility]) criteria for post-menopausal, Caucasian Women: Normal       T-score at or above -1 SD Osteopenia   T-score between -1 and -2.5 SD Osteoporosis T-score at or below -2.5 SD   RECOMMENDATION: 1. All patients should optimize calcium and vitamin D intake. 2. Consider FDA-approved medical therapies in postmenopausal women and men aged 47  years and older, based on the following: a. A hip or vertebral (clinical or morphometric) fracture. b. T-score = -2.5 at the femoral neck or spine after appropriate evaluation to exclude secondary causes. c. Low bone mass (T-score between -1.0 and -2.5 at the femoral neck or spine) and a 10-year probability of a hip fracture = 3% or a 10-year probability of a major osteoporosis-related fracture = 20% based on the [place]-adapted WHO algorithm. d. Clinician judgment and/or patient preferences may indicate treatment for people with 10-year fracture probabilities above or below these levels.   FOLLOW-UP: Patients with diagnosis of osteoporosis or at high risk for fracture should have regular bone mineral density tests.? Patients eligible for Medicare are allowed routine testing every 2 years.? The testing frequency can be increased to one year for  patients who have rapidly progressing disease, are receiving or discontinuing medical therapy to restore bone mass, or have additional risk factors.   I have reviewed this study and agree with the findings. [facility]     Electronically Signed   By: Romona Curls M.D.   On: 08/24/2022 08:04    Exam:  Vitals:   03/30/23 1324  BP: 122/68  Weight: 138 lb (62.6 kg)  Height: 5' 4.25" (1.632 m)   Body mass index is 23.5 kg/m.  General appearance:  Normal Thyroid:  Symmetrical, normal in size, without palpable masses or nodularity. Respiratory  Auscultation:  Clear without wheezing or rhonchi Cardiovascular  Auscultation:  Regular rate, without rubs, murmurs or gallops  Edema/varicosities:  Not grossly evident Abdominal  Soft,nontender, without masses, guarding or rebound.  Liver/spleen:  No organomegaly noted  Hernia:  None appreciated  Skin  Inspection:  Grossly normal Breasts: Examined lying and sitting.   Right: Without masses, retractions, nipple discharge or axillary adenopathy.   Left: Without masses, retractions, nipple discharge or axillary adenopathy. Genitourinary   Inguinal/mons:  Normal without inguinal adenopathy  External genitalia:  Normal appearing vulva with no masses, tenderness, or lesions  BUS/Urethra/Skene's glands:  Normal  Vagina:  Normal appearing with normal color and discharge, no lesions. Atrophy: mild   Cervix:  Normal appearing without discharge or lesions  Uterus:  Normal in size, shape and contour.  Midline and mobile, nontender  Adnexa/parametria:     Rt: Normal in size, without masses or tenderness.   Lt: Normal in size, without masses or tenderness.  Anus and perineum: Normal    Raynelle Fanning, CMA present for exam  Assessment/Plan:   1. Well woman exam with routine gynecological exam Pp 2020 normal, paps d/c'd  2. Age-related osteoporosis without current pathological fracture Open to starting prolia, will pre  cert  3. Vaginal atrophy - Estradiol 10 MCG TABS vaginal tablet; INSERT 1 TABLET VAGINALLY TWICE A WEEK.  Dispense: 24 tablet; Refill: 3    Discussed SBE, colonoscopy and DEXA screening as directed. Recommend of exercise weekly, including weight bearing exercise. Encouraged the use of seatbelts and sunscreen.  Return in 1 year for annual or sooner prn.  Arlie Solomons B WHNP-BC, 1:54 PM 03/30/2023

## 2023-03-30 NOTE — Telephone Encounter (Signed)
-----   Message from [place], [place] B sent at 03/30/2023  1:59 PM EDT ----- Regarding: Prolia start Pt would like to start Prolia for treatment of osteoporosis, pt aware she will receive a call from office once it is approved by insurance.

## 2023-04-06 ENCOUNTER — Inpatient Hospital Stay: Payer: PPO | Admitting: Hematology and Oncology

## 2023-04-06 ENCOUNTER — Encounter: Payer: Self-pay | Admitting: Hematology and Oncology

## 2023-04-06 ENCOUNTER — Inpatient Hospital Stay: Payer: PPO | Attending: Hematology and Oncology

## 2023-04-06 VITALS — BP 125/61 | HR 93 | Temp 97.5°F | Resp 18 | Ht 64.25 in | Wt 140.2 lb

## 2023-04-06 DIAGNOSIS — J343 Hypertrophy of nasal turbinates: Secondary | ICD-10-CM | POA: Diagnosis not present

## 2023-04-06 DIAGNOSIS — R9230 Dense breasts, unspecified: Secondary | ICD-10-CM | POA: Diagnosis not present

## 2023-04-06 DIAGNOSIS — Z887 Allergy status to serum and vaccine status: Secondary | ICD-10-CM | POA: Insufficient documentation

## 2023-04-06 DIAGNOSIS — J0101 Acute recurrent maxillary sinusitis: Secondary | ICD-10-CM | POA: Diagnosis not present

## 2023-04-06 DIAGNOSIS — D801 Nonfamilial hypogammaglobulinemia: Secondary | ICD-10-CM

## 2023-04-06 DIAGNOSIS — C9110 Chronic lymphocytic leukemia of B-cell type not having achieved remission: Secondary | ICD-10-CM | POA: Insufficient documentation

## 2023-04-06 DIAGNOSIS — Z880 Allergy status to penicillin: Secondary | ICD-10-CM | POA: Diagnosis not present

## 2023-04-06 DIAGNOSIS — Z79899 Other long term (current) drug therapy: Secondary | ICD-10-CM | POA: Insufficient documentation

## 2023-04-06 DIAGNOSIS — Z882 Allergy status to sulfonamides status: Secondary | ICD-10-CM | POA: Diagnosis not present

## 2023-04-06 DIAGNOSIS — H9041 Sensorineural hearing loss, unilateral, right ear, with unrestricted hearing on the contralateral side: Secondary | ICD-10-CM | POA: Diagnosis not present

## 2023-04-06 LAB — CBC WITH DIFFERENTIAL/PLATELET
Abs Immature Granulocytes: 0.02 10*3/uL (ref 0.00–0.07)
Basophils Absolute: 0 10*3/uL (ref 0.0–0.1)
Basophils Relative: 0 %
Eosinophils Absolute: 0.1 10*3/uL (ref 0.0–0.5)
Eosinophils Relative: 1 %
HCT: 33.4 % — ABNORMAL LOW (ref 36.0–46.0)
Hemoglobin: 11.2 g/dL — ABNORMAL LOW (ref 12.0–15.0)
Immature Granulocytes: 0 %
Lymphocytes Relative: 21 %
Lymphs Abs: 1.4 10*3/uL (ref 0.7–4.0)
MCH: 28.4 pg (ref 26.0–34.0)
MCHC: 33.5 g/dL (ref 30.0–36.0)
MCV: 84.6 fL (ref 80.0–100.0)
Monocytes Absolute: 0.5 10*3/uL (ref 0.1–1.0)
Monocytes Relative: 7 %
Neutro Abs: 4.7 10*3/uL (ref 1.7–7.7)
Neutrophils Relative %: 71 %
Platelets: 244 10*3/uL (ref 150–400)
RBC: 3.95 MIL/uL (ref 3.87–5.11)
RDW: 12.5 % (ref 11.5–15.5)
WBC: 6.7 10*3/uL (ref 4.0–10.5)
nRBC: 0 % (ref 0.0–0.2)

## 2023-04-06 NOTE — Telephone Encounter (Signed)
Insurance information submitted to [employer] portal. Will await summary of benefits for prolia.    

## 2023-04-06 NOTE — Progress Notes (Signed)
[facility] OFFICE PROGRESS NOTE  Patient Care Team: Shireen Quan, DO as PCP - General (Family Medicine) Artis Delay, MD as Consulting Physician (Hematology and Oncology) Janalyn Harder, MD (Inactive) as Consulting Physician (Dermatology) Tanda Rockers, NP as Nurse Practitioner (Obstetrics and Gynecology)  ASSESSMENT & PLAN:  CLL (chronic lymphocytic leukemia) [facility]) She has no signs of disease progression from CLL perspective but have recurrent infection due to secondary acquired hypogammaglobulinemia We discussed risk and benefits of IVIG treatment I will see her back in a year for further follow-up Discussed preventative vaccination  Acquired hypogammaglobulinemia (HCC) She is known to have acquired hypogammaglobulinemia We discussed risk and benefits of IVIG infusion to reduce risk of recurrent infection in the future She would like to think about it but right now she is leaning towards no treatment If she changes her mind, she will let me know  No orders of the defined types were placed in this encounter.   All questions were answered. The patient knows to call the clinic with any problems, questions or concerns. The total time spent in the appointment was 20 minutes encounter with patients including review of chart and various tests results, discussions about plan of care and coordination of care plan   Artis Delay, MD 04/06/2023 1:22 PM  INTERVAL HISTORY: Please see below for problem oriented charting. she returns for surveillance follow-up for CLL She is doing well No recent infection She has no signs or symptoms to suggest disease progression We discussed risk and benefits of preventative IVIG treatment  REVIEW OF SYSTEMS:   Constitutional: Denies fevers, chills or abnormal weight loss Eyes: Denies blurriness of vision Ears, nose, mouth, throat, and face: Denies mucositis or sore throat Respiratory: Denies cough, dyspnea or wheezes Cardiovascular:  Denies palpitation, chest discomfort or lower extremity swelling Gastrointestinal:  Denies nausea, heartburn or change in bowel habits Skin: Denies abnormal skin rashes Lymphatics: Denies new lymphadenopathy or easy bruising Neurological:Denies numbness, tingling or new weaknesses Behavioral/Psych: Mood is stable, no new changes  All other systems were reviewed with the patient and are negative.  I have reviewed the past medical history, past surgical history, social history and family history with the patient and they are unchanged from previous note.  ALLERGIES:  is allergic to contrast media [iodinated contrast media], amoxicillin, influenza virus vac live quad, lipitor [atorvastatin], other, provera [medroxyprogesterone acetate], sulfa antibiotics, and tetanus toxoids.  MEDICATIONS:  Current Outpatient Medications  Medication Sig Dispense Refill   Calcium-Magnesium-Vitamin D 185-50-100 MG-MG-UNIT CAPS Take 1 tablet by mouth daily.     cholecalciferol (VITAMIN D3) 25 MCG (1000 UNIT) tablet Take 2,000 Units by mouth daily.     Estradiol 10 MCG TABS vaginal tablet INSERT 1 TABLET VAGINALLY TWICE A WEEK. 24 tablet 3   ezetimibe (ZETIA) 10 MG tablet 1 tablet Orally Once a day for 90 days     hydrochlorothiazide (HYDRODIURIL) 25 MG tablet Take 0.5 tablets by mouth daily.     ibuprofen (ADVIL) 200 MG tablet      NON FORMULARY Align for Women     Polyethyl Glycol-Propyl Glycol (SYSTANE PRESERVATIVE FREE OP) Apply to eye.     No current facility-administered medications for this visit.    SUMMARY OF ONCOLOGIC HISTORY: Oncology History  CLL (chronic lymphocytic leukemia) (HCC)  02/24/2004 Pathology Results    Case #: [id number] confirmed CLL   01/23/2017 Pathology Results   Peripheral Blood Flow Cytometry - CD5-POSITIVE MONOCLONAL B CELL POPULATION MOST CONSISTENT WITH CHRONIC LYMPHOCYTIC LEUKEMIA -  SEE COMMENT Diagnosis Comment: Examination of the peripheral blood reveals a lymphocytosis  with frequent small lymphocytes that have clumped chromatin and scant cytoplasm . Larger cells with prominent nucleoli are not identified. By flow cytometry, a B-cell population that expresses CD5, CD19, CD20, CD21, CD22, HLA-DR and CD23 comprises 46% of all lymphocytes. Overall, the morphology and immunophenotype are consistent with the patient's history of chronic lymphocytic leukemia   01/23/2017 Pathology Results   FISh confirmed deletion 13 q    01/23/2018 Cancer Staging   Staging form: Chronic Lymphocytic Leukemia / Small Lymphocytic Lymphoma, AJCC 8th Edition - Clinical: Modified Rai Stage 0 (Modified Rai risk: Low, Lymphocytosis: Present, Adenopathy: Absent, Organomegaly: Absent, Anemia: Absent, Thrombocytopenia: Absent) - Signed by Artis Delay, MD on 01/23/2018     PHYSICAL EXAMINATION: ECOG PERFORMANCE STATUS: 0 - Asymptomatic  Vitals:   04/06/23 0831  BP: 125/61  Pulse: 93  Resp: 18  Temp: (!) 97.5 F (36.4 C)  SpO2: 100%   Filed Weights   04/06/23 0831  Weight: 140 lb 3.2 oz (63.6 kg)    GENERAL:alert, no distress and comfortable  NEURO: alert & oriented x 3 with fluent speech, no focal motor/sensory deficits  LABORATORY DATA:  I have reviewed the data as listed    Component Value Date/Time   NA 141 05/04/2018 1249   NA 139 08/25/2014 1309   K 4.0 05/04/2018 1249   K 3.8 08/25/2014 1309   CL 106 05/04/2018 1249   CL 102 08/29/2012 0934   CO2 28 05/04/2018 1249   CO2 23 08/25/2014 1309   GLUCOSE 92 05/04/2018 1249   GLUCOSE 99 08/25/2014 1309   GLUCOSE 100 (H) 08/29/2012 0934   BUN 13 05/04/2018 1249   BUN 13.8 08/25/2014 1309   CREATININE 0.72 05/04/2018 1249   CREATININE 0.6 08/25/2014 1309   CALCIUM 9.8 05/04/2018 1249   CALCIUM 9.8 08/25/2014 1309   PROT 6.8 05/04/2018 1249   PROT 6.8 08/25/2014 1309   ALBUMIN 4.2 05/04/2018 1249   ALBUMIN 4.4 08/25/2014 1309   AST 19 05/04/2018 1249   AST 18 08/25/2014 1309   ALT 20 05/04/2018 1249   ALT 21  08/25/2014 1309   ALKPHOS 108 05/04/2018 1249   ALKPHOS 111 08/25/2014 1309   BILITOT 0.6 05/04/2018 1249   BILITOT 0.54 08/25/2014 1309   GFRNONAA >60 05/04/2018 1249   GFRAA >60 05/04/2018 1249    No results found for: "SPEP", "UPEP"  Lab Results  Component Value Date   WBC 6.7 04/06/2023   NEUTROABS 4.7 04/06/2023   HGB 11.2 (L) 04/06/2023   HCT 33.4 (L) 04/06/2023   MCV 84.6 04/06/2023   PLT 244 04/06/2023      Chemistry      Component Value Date/Time   NA 141 05/04/2018 1249   NA 139 08/25/2014 1309   K 4.0 05/04/2018 1249   K 3.8 08/25/2014 1309   CL 106 05/04/2018 1249   CL 102 08/29/2012 0934   CO2 28 05/04/2018 1249   CO2 23 08/25/2014 1309   BUN 13 05/04/2018 1249   BUN 13.8 08/25/2014 1309   CREATININE 0.72 05/04/2018 1249   CREATININE 0.6 08/25/2014 1309      Component Value Date/Time   CALCIUM 9.8 05/04/2018 1249   CALCIUM 9.8 08/25/2014 1309   ALKPHOS 108 05/04/2018 1249   ALKPHOS 111 08/25/2014 1309   AST 19 05/04/2018 1249   AST 18 08/25/2014 1309   ALT 20 05/04/2018 1249   ALT 21 08/25/2014 1309  BILITOT 0.6 05/04/2018 1249   BILITOT 0.54 08/25/2014 1309       RADIOGRAPHIC STUDIES: I have personally reviewed the radiological images as listed and agreed with the findings in the report. MM 3D SCREENING MAMMOGRAM BILATERAL BREAST  Result Date: 03/20/2023 CLINICAL DATA:  Screening. EXAM: DIGITAL SCREENING BILATERAL MAMMOGRAM WITH TOMOSYNTHESIS AND CAD TECHNIQUE: Bilateral screening digital craniocaudal and mediolateral oblique mammograms were obtained. Bilateral screening digital breast tomosynthesis was performed. The images were evaluated with computer-aided detection. COMPARISON:  Previous exam(s). ACR Breast Density Category b: There are scattered areas of fibroglandular density. FINDINGS: There are no findings suspicious for malignancy. IMPRESSION: No mammographic evidence of malignancy. A result letter of this screening mammogram will be  mailed directly to the patient. RECOMMENDATION: Screening mammogram in one year. (Code:[id number]-B-01Y) BI-RADS CATEGORY  1: Negative. Electronically Signed   By: Frederico Hamman M.D.   On: 03/20/2023 13:26

## 2023-04-06 NOTE — Assessment & Plan Note (Signed)
She has no signs of disease progression from CLL perspective but have recurrent infection due to secondary acquired hypogammaglobulinemia We discussed risk and benefits of IVIG treatment I will see her back in a year for further follow-up Discussed preventative vaccination

## 2023-04-06 NOTE — Assessment & Plan Note (Signed)
She is known to have acquired hypogammaglobulinemia We discussed risk and benefits of IVIG infusion to reduce risk of recurrent infection in the future She would like to think about it but right now she is leaning towards no treatment If she changes her mind, she will let me know

## 2023-04-08 LAB — IGG, IGA, IGM
IgA: 115 mg/dL (ref 64–422)
IgG (Immunoglobin G), Serum: 516 mg/dL — ABNORMAL LOW (ref 586–1602)
IgM (Immunoglobulin M), Srm: 86 mg/dL (ref 26–217)

## 2023-04-10 ENCOUNTER — Telehealth: Payer: Self-pay

## 2023-04-10 DIAGNOSIS — H524 Presbyopia: Secondary | ICD-10-CM | POA: Diagnosis not present

## 2023-04-10 DIAGNOSIS — H532 Diplopia: Secondary | ICD-10-CM | POA: Diagnosis not present

## 2023-04-10 NOTE — Telephone Encounter (Signed)
Called per Dr. Bertis Ruddy and given IgG level results that are low, she qualifies for IVIG but did not want as last office visit. She would like a virtual visit to discuss IVIG further. Sent Dr. Bertis Ruddy a message.

## 2023-04-11 ENCOUNTER — Telehealth: Payer: Self-pay

## 2023-04-11 NOTE — Telephone Encounter (Signed)
Called and scheduled telephone visit with Dr. Bertis Ruddy on 10/3 at 1240 to discuss IVIG. She is aware of appt time/date.

## 2023-04-28 DIAGNOSIS — D1801 Hemangioma of skin and subcutaneous tissue: Secondary | ICD-10-CM | POA: Diagnosis not present

## 2023-04-28 DIAGNOSIS — H16421 Pannus (corneal), right eye: Secondary | ICD-10-CM | POA: Diagnosis not present

## 2023-04-28 DIAGNOSIS — H3554 Dystrophies primarily involving the retinal pigment epithelium: Secondary | ICD-10-CM | POA: Diagnosis not present

## 2023-04-28 DIAGNOSIS — H524 Presbyopia: Secondary | ICD-10-CM | POA: Diagnosis not present

## 2023-04-28 DIAGNOSIS — H04123 Dry eye syndrome of bilateral lacrimal glands: Secondary | ICD-10-CM | POA: Diagnosis not present

## 2023-04-28 DIAGNOSIS — H18593 Other hereditary corneal dystrophies, bilateral: Secondary | ICD-10-CM | POA: Diagnosis not present

## 2023-04-28 DIAGNOSIS — H532 Diplopia: Secondary | ICD-10-CM | POA: Diagnosis not present

## 2023-04-28 DIAGNOSIS — H02831 Dermatochalasis of right upper eyelid: Secondary | ICD-10-CM | POA: Diagnosis not present

## 2023-04-28 DIAGNOSIS — H02834 Dermatochalasis of left upper eyelid: Secondary | ICD-10-CM | POA: Diagnosis not present

## 2023-05-02 NOTE — Telephone Encounter (Signed)
Prior authorization form filled out and faxed to [facility] Advantage Utilization Management Department for review at [phone]. Will await response.

## 2023-05-04 ENCOUNTER — Encounter: Payer: Self-pay | Admitting: Hematology and Oncology

## 2023-05-04 ENCOUNTER — Inpatient Hospital Stay: Payer: PPO | Attending: Hematology and Oncology | Admitting: Hematology and Oncology

## 2023-05-04 DIAGNOSIS — D801 Nonfamilial hypogammaglobulinemia: Secondary | ICD-10-CM

## 2023-05-04 DIAGNOSIS — C9110 Chronic lymphocytic leukemia of B-cell type not having achieved remission: Secondary | ICD-10-CM

## 2023-05-04 NOTE — Assessment & Plan Note (Signed)
She is known to have acquired hypogammaglobulinemia We discussed risk and benefits of IVIG infusion to reduce risk of recurrent infection in the future She would like to think about and I recommend the patient to inquire through her insurance to see and determine her co-pay She will get back to me if she is interested to get IVIG treatment

## 2023-05-04 NOTE — Progress Notes (Signed)
HEMATOLOGY-ONCOLOGY ELECTRONIC VISIT PROGRESS NOTE  Patient Care Team: Shireen Quan, DO as PCP - General (Family Medicine) Artis Delay, MD as Consulting Physician (Hematology and Oncology) Janalyn Harder, MD (Inactive) as Consulting Physician (Dermatology) Tanda Rockers, NP as Nurse Practitioner (Obstetrics and Gynecology)  I connected with the patient via telephone conference and verified that I am speaking with the correct person using two identifiers. The patient's location is at home and I am providing care from the [facility] I discussed the limitations, risks, security and privacy concerns of performing an evaluation and management service by e-visits and the availability of in person appointments.  I also discussed with the patient that there may be a patient responsible charge related to this service. The patient expressed understanding and agreed to proceed.   ASSESSMENT & PLAN:  CLL (chronic lymphocytic leukemia) (HCC) She has no signs of disease progression from CLL perspective but have recurrent infection due to secondary acquired hypogammaglobulinemia We discussed risk and benefits of IVIG treatment  Acquired hypogammaglobulinemia (HCC) She is known to have acquired hypogammaglobulinemia We discussed risk and benefits of IVIG infusion to reduce risk of recurrent infection in the future She would like to think about and I recommend the patient to inquire through her insurance to see and determine her co-pay She will get back to me if she is interested to get IVIG treatment  No orders of the defined types were placed in this encounter.   INTERVAL HISTORY: Please see below for problem oriented charting. The purpose of today's discussion is to review the risk and benefits of IVIG treatment We discussed this in our previous visit She appears to be interested to consider IVIG treatment in the future  SUMMARY OF ONCOLOGIC HISTORY: Oncology History  CLL (chronic  lymphocytic leukemia) (HCC)  02/24/2004 Pathology Results    Case #: [id number] confirmed CLL   01/23/2017 Pathology Results   Peripheral Blood Flow Cytometry - CD5-POSITIVE MONOCLONAL B CELL POPULATION MOST CONSISTENT WITH CHRONIC LYMPHOCYTIC LEUKEMIA - SEE COMMENT Diagnosis Comment: Examination of the peripheral blood reveals a lymphocytosis with frequent small lymphocytes that have clumped chromatin and scant cytoplasm . Larger cells with prominent nucleoli are not identified. By flow cytometry, a B-cell population that expresses CD5, CD19, CD20, CD21, CD22, HLA-DR and CD23 comprises 46% of all lymphocytes. Overall, the morphology and immunophenotype are consistent with the patient's history of chronic lymphocytic leukemia   01/23/2017 Pathology Results   FISh confirmed deletion 13 q    01/23/2018 Cancer Staging   Staging form: Chronic Lymphocytic Leukemia / Small Lymphocytic Lymphoma, AJCC 8th Edition - Clinical: Modified Rai Stage 0 (Modified Rai risk: Low, Lymphocytosis: Present, Adenopathy: Absent, Organomegaly: Absent, Anemia: Absent, Thrombocytopenia: Absent) - Signed by Artis Delay, MD on 01/23/2018     REVIEW OF SYSTEMS:   Constitutional: Denies fevers, chills or abnormal weight loss Eyes: Denies blurriness of vision Ears, nose, mouth, throat, and face: Denies mucositis or sore throat Respiratory: Denies cough, dyspnea or wheezes Cardiovascular: Denies palpitation, chest discomfort Gastrointestinal:  Denies nausea, heartburn or change in bowel habits Skin: Denies abnormal skin rashes Lymphatics: Denies new lymphadenopathy or easy bruising Neurological:Denies numbness, tingling or new weaknesses Behavioral/Psych: Mood is stable, no new changes  Extremities: No lower extremity edema All other systems were reviewed with the patient and are negative.  I have reviewed the past medical history, past surgical history, social history and family history with the patient and they are  unchanged from previous note.  ALLERGIES:  is allergic  to contrast media [iodinated contrast media], amoxicillin, influenza virus vac live quad, lipitor [atorvastatin], other, provera [medroxyprogesterone acetate], sulfa antibiotics, and tetanus toxoids.  MEDICATIONS:  Current Outpatient Medications  Medication Sig Dispense Refill   Calcium-Magnesium-Vitamin D 185-50-100 MG-MG-UNIT CAPS Take 1 tablet by mouth daily.     cholecalciferol (VITAMIN D3) 25 MCG (1000 UNIT) tablet Take 2,000 Units by mouth daily.     Estradiol 10 MCG TABS vaginal tablet INSERT 1 TABLET VAGINALLY TWICE A WEEK. 24 tablet 3   ezetimibe (ZETIA) 10 MG tablet 1 tablet Orally Once a day for 90 days     hydrochlorothiazide (HYDRODIURIL) 25 MG tablet Take 0.5 tablets by mouth daily.     ibuprofen (ADVIL) 200 MG tablet      NON FORMULARY Align for Women     Polyethyl Glycol-Propyl Glycol (SYSTANE PRESERVATIVE FREE OP) Apply to eye.     No current facility-administered medications for this visit.    PHYSICAL EXAMINATION: ECOG PERFORMANCE STATUS: 0 - Asymptomatic  LABORATORY DATA:  I have reviewed the data as listed    Latest Ref Rng & Units 05/04/2018   12:49 PM 08/25/2014    1:09 PM 08/27/2013   10:35 AM  CMP  Glucose 70 - 99 mg/dL 92  99  409   BUN 8 - 23 mg/dL 13  81.1  91.4   Creatinine 0.44 - 1.00 mg/dL 7.82  0.6  0.6   Sodium 135 - 145 mmol/L 141  139  141   Potassium 3.5 - 5.1 mmol/L 4.0  3.8  4.2   Chloride 98 - 111 mmol/L 106     CO2 22 - 32 mmol/L 28  23  28    Calcium 8.9 - 10.3 mg/dL 9.8  9.8  95.6   Total Protein 6.5 - 8.1 g/dL 6.8  6.8  7.1   Total Bilirubin 0.3 - 1.2 mg/dL 0.6  2.13  0.86   Alkaline Phos 38 - 126 U/L 108  111  111   AST 15 - 41 U/L 19  18  22    ALT 0 - 44 U/L 20  21  34     Lab Results  Component Value Date   WBC 6.7 04/06/2023   HGB 11.2 (L) 04/06/2023   HCT 33.4 (L) 04/06/2023   MCV 84.6 04/06/2023   PLT 244 04/06/2023   NEUTROABS 4.7 04/06/2023    I discussed the  assessment and treatment plan with the patient. The patient was provided an opportunity to ask questions and all were answered. The patient agreed with the plan and demonstrated an understanding of the instructions. The patient was advised to call back or seek an in-person evaluation if the symptoms worsen or if the condition fails to improve as anticipated.    I spent 20 minutes for the appointment reviewing test results, discuss management and coordination of care.  Artis Delay, MD 05/04/2023 12:59 PM

## 2023-05-04 NOTE — Assessment & Plan Note (Signed)
She has no signs of disease progression from CLL perspective but have recurrent infection due to secondary acquired hypogammaglobulinemia We discussed risk and benefits of IVIG treatment

## 2023-05-23 NOTE — Telephone Encounter (Signed)
Deductible:  N/A  OOP MAX: $3,200 ($303.88 met)   Annual exam: 03-10-23 JC  Calcium:     9.8       Date: 01-06-23 via [facility] portal   Upcoming dental procedures: No   Hx of Kidney Disease: No   Last Bone Density Scan: 08/24/22  Is Prior Authorization needed: yes, approved, Authorization #: [id number]  Pt estimated Cost: $276.80   Coverage Details: Prolia will be subject to a 20% coinsurance up to a $3,200 OOP max ($303.88 met). Administration will be covered at 100%. No deductible applies.

## 2023-05-23 NOTE — Telephone Encounter (Signed)
Message left to return call to Klay Sobotka at [phone].   

## 2023-06-06 NOTE — Telephone Encounter (Signed)
Call to patient. Prolia summary of benefits reviewed with patient and she verbalized understanding. Patient would like to reach out to insurance company directly to discuss coinsurance and will return call to RN.

## 2023-06-08 MED ORDER — DENOSUMAB 60 MG/ML ~~LOC~~ SOSY: 60.0000 mg | PREFILLED_SYRINGE | Freq: Once | SUBCUTANEOUS | 0 refills | Status: DC

## 2023-06-08 NOTE — Telephone Encounter (Signed)
Patient left voicemail stating she spoke with insurance and requests prolia injection be sent to [facility] as she has a $200 copay for injection per insurance rep.   Medication pended for #1, 0RF. Routing to provider to review and advise on prescription.

## 2023-06-08 NOTE — Telephone Encounter (Signed)
Returned call to patient. Advised patient that RN spoke with [place] at [facility], who states they do not have access to ordering prolia injections & will need to go through [employer]. Patient verbalized understanding and agreeable.   Per Summary Of Benefits, [facility] (ph #: [phone]) is specialty pharmacy.   Medication pended to [facility].

## 2023-06-15 NOTE — Telephone Encounter (Signed)
Patient left voicemail stating she authorized delivery of prolia injection and it should be delivered on 06/21/23. Will contact patient once prolia injection arrives in office to schedule appointment.

## 2023-06-22 ENCOUNTER — Ambulatory Visit: Payer: PPO

## 2023-06-22 DIAGNOSIS — M810 Age-related osteoporosis without current pathological fracture: Secondary | ICD-10-CM | POA: Diagnosis not present

## 2023-06-22 MED ORDER — DENOSUMAB 60 MG/ML ~~LOC~~ SOSY
60.0000 mg | PREFILLED_SYRINGE | Freq: Once | SUBCUTANEOUS | Status: AC
  Administered 2023-06-22: 60 mg via SUBCUTANEOUS

## 2023-07-31 DIAGNOSIS — E7800 Pure hypercholesterolemia, unspecified: Secondary | ICD-10-CM | POA: Diagnosis not present

## 2023-07-31 DIAGNOSIS — E559 Vitamin D deficiency, unspecified: Secondary | ICD-10-CM | POA: Diagnosis not present

## 2023-08-01 DIAGNOSIS — R4781 Slurred speech: Secondary | ICD-10-CM | POA: Diagnosis not present

## 2023-08-01 DIAGNOSIS — R41840 Attention and concentration deficit: Secondary | ICD-10-CM | POA: Diagnosis not present

## 2023-08-21 ENCOUNTER — Ambulatory Visit: Payer: PPO | Attending: Family Medicine

## 2023-08-21 ENCOUNTER — Other Ambulatory Visit: Payer: Self-pay

## 2023-08-21 DIAGNOSIS — R1310 Dysphagia, unspecified: Secondary | ICD-10-CM | POA: Diagnosis present

## 2023-08-21 NOTE — Therapy (Signed)
OUTPATIENT SPEECH LANGUAGE PATHOLOGY EVALUATION   Patient Name: Audrey Sweeney MRN: [id number] DOB:01-10-1946, 78 y.o., female Today's Date: 08/21/2023  PCP: Lind Covert, MD REFERRING PROVIDER: Same as PCP  END OF SESSION:  End of Session - 08/21/23 2140     Visit Number 1    Number of Visits 1    Date for SLP Re-Evaluation 08/21/23    SLP Start Time 1450    SLP Stop Time  1532    SLP Time Calculation (min) 42 min    Activity Tolerance Patient tolerated treatment well             Past Medical History:  Diagnosis Date   Abnormal MRI    Abnormal Pap smear of cervix    ?   Ankylosing spondylitis (HCC)    Arthritis    C. difficile colitis    Cervical disc disease    CLL (chronic lymphocytic leukemia) (HCC)    Rai stage 0.   Colon polyps    Depression    Hypercholesterolemia    Hypertension    IBS (irritable bowel syndrome)    Osteopenia    Osteoporosis    Rheumatoid arthritis(714.0)    Seasonal allergies    Statin intolerance    Past Surgical History:  Procedure Laterality Date   CESAREAN SECTION     COLONOSCOPY W/ POLYPECTOMY     Pre-cancerous polyps removed.   ENDOMETRIAL BIOPSY     8/98   TUBAL LIGATION     Patient Active Problem List   Diagnosis Date Noted   Intermittent esotropia 11/13/2019   Abnormal mammogram of right breast 01/31/2019   Chronic sinusitis, unspecified 01/23/2018   Acquired hypogammaglobulinemia (HCC) 01/23/2018   Diverticulosis 03/10/2017   Family history of colon cancer 03/10/2017   History of adenomatous polyp of colon 03/10/2017   RA (rheumatoid arthritis) (HCC) 03/10/2017   Fatty infiltration of liver 01/23/2017   CLL (chronic lymphocytic leukemia) (HCC) 11/13/2006    ONSET DATE: Fall 2024   REFERRING DIAG: R47.81 (ICD-10-CM) - Slurring of speech  THERAPY DIAG:  Dysphagia, unspecified type  Dysarthria and anarthria  Speech dysfluency  Rationale for Evaluation and Treatment: Rehabilitation  SUBJECTIVE:    SUBJECTIVE STATEMENT: "I was really stressed in the fall. Now it's not bad." Pt tells SLP she had a significant dysfluency until ~age 41.  Pt accompanied by: self  PERTINENT HISTORY:  Pt went to PCP 08/01/23 and reported she had slurring of words, occasionally, along with "being fuzzy headed", difficulty staying organized and trouble concentrating. Pt states now these sx have mainly subsided, and she feels like they were caused by stress.  PAIN:  Are you having pain? Yes: NPRS scale: 3/10 Pain location: Stomach/abdomen Pain description: Sore  FALLS: Has patient fallen in last 6 months?  No  LIVING ENVIRONMENT: Lives with: lives with their spouse Lives in: House/apartment  PLOF:  Level of assistance: Independent with ADLs, Independent with IADLs Employment: Part-time employment  PATIENT GOALS: See what is going on   OBJECTIVE:  Note: Objective measures were completed at Evaluation unless otherwise noted.  COGNITION: Overall cognitive status: Within functional limits for tasks assessed  AUDITORY COMPREHENSION: Overall auditory comprehension: Appears intact YES/NO questions: Appears intact Following directions: Appears intact Conversation: WNL for tasks today in mod complex/complex conversation  EXPRESSION: verbal  VERBAL EXPRESSION: Level of generative/spontaneous verbalization: conversation Automatic speech: counting: intact and month of year: intact  Repetition:  short (<.5 second) pauses/hesitations "I was trying to keep from stuttering" Pragmatics: Appears  intact Comments: In 20 minutes of mod complex/complex conversation today SLP did not appreciate any expressive or receptive aphasia.  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 dramatically since married (1966) Level of impairment: Conversation Respiration: thoracic breathing and diaphragmatic/abdominal breathing Phonation: normal Resonance:  WFL Articulation: Appears intact Intelligibility: Intelligible Motor planning:  impaired at baseline two prolongations today < .5 seconds in conversation ("y" on "yearly" and "l" on "long")  ORAL MOTOR EXAMINATION: Overall status: WFL                                                                                                                            TREATMENT DATE:  08/21/23: N/A  PATIENT EDUCATION: Education details: MBS basics, rationale for recommending MBS, if pt requires ST for swallowing she will return here Person educated: Patient Education method: Explanation Education comprehension: verbalized understanding   GOALS: Goals reviewed with patient? No - evaluation only   ASSESSMENT:  CLINICAL IMPRESSION: Patient is a 78 y.o. F who was seen today for assessment of speech skills following a transient period during high job stress - pt was working approx 40 hours at part-time status at work. She states referring sx have all but subsided at this time.  Pt was a sever stutterer until approx age 20 when she got married. Today, SLP appreciated two dysfluencies- specifically two prolongations of < 0.5 second on "y" and on "l" in the initial position. [name] does endorse swallowing difficulties including "when I eat spicy or sugary foods the back of my throat gets irritated and I start to cough." She also endorses coughing more frequently than she would expect when talking and eating simultaneously, and this can occur with different types of foods For these reasons, MBS is recommended.   OBJECTIVE IMPAIRMENTS: include dysphagia. These impairments are limiting patient from safety when swallowing. Factors affecting potential to achieve goals and functional outcome are  none noted today . Patient will benefit from skilled SLP services to address above impairments and improve overall function.  REHAB POTENTIAL: Good  PLAN:  MBS recommended due to pt's report of dysphagia in  situations/circumstances mentioned above. If she requires ST for dysphagia, she can return to this clinic for outpatient swallowing tx. Pt's dysarthria sx were not appreciated during today's exam.    Roderick Sweezy, CCC-SLP 08/21/2023, 9:40 PM

## 2023-08-22 ENCOUNTER — Other Ambulatory Visit (HOSPITAL_COMMUNITY): Payer: Self-pay | Admitting: *Deleted

## 2023-08-22 DIAGNOSIS — R1310 Dysphagia, unspecified: Secondary | ICD-10-CM

## 2023-09-13 ENCOUNTER — Ambulatory Visit (HOSPITAL_COMMUNITY)
Admission: RE | Admit: 2023-09-13 | Discharge: 2023-09-13 | Disposition: A | Discharge status: Home / Self Care | Payer: PPO | Source: Ambulatory Visit | Attending: *Deleted | Admitting: *Deleted

## 2023-09-13 DIAGNOSIS — R1310 Dysphagia, unspecified: Secondary | ICD-10-CM

## 2023-09-13 DIAGNOSIS — Z9181 History of falling: Secondary | ICD-10-CM | POA: Insufficient documentation

## 2023-09-13 DIAGNOSIS — R059 Cough, unspecified: Secondary | ICD-10-CM | POA: Insufficient documentation

## 2023-09-13 DIAGNOSIS — R4781 Slurred speech: Secondary | ICD-10-CM | POA: Insufficient documentation

## 2023-09-13 DIAGNOSIS — C9110 Chronic lymphocytic leukemia of B-cell type not having achieved remission: Secondary | ICD-10-CM | POA: Diagnosis not present

## 2023-10-05 ENCOUNTER — Encounter (INDEPENDENT_AMBULATORY_CARE_PROVIDER_SITE_OTHER): Payer: Self-pay

## 2023-10-05 ENCOUNTER — Ambulatory Visit (INDEPENDENT_AMBULATORY_CARE_PROVIDER_SITE_OTHER): Payer: PPO | Admitting: Otolaryngology

## 2023-10-05 VITALS — BP 137/71 | HR 90 | Ht 66.0 in | Wt 140.0 lb

## 2023-10-05 DIAGNOSIS — J310 Chronic rhinitis: Secondary | ICD-10-CM

## 2023-10-05 DIAGNOSIS — J343 Hypertrophy of nasal turbinates: Secondary | ICD-10-CM | POA: Diagnosis not present

## 2023-10-05 DIAGNOSIS — H9203 Otalgia, bilateral: Secondary | ICD-10-CM

## 2023-10-05 DIAGNOSIS — H9041 Sensorineural hearing loss, unilateral, right ear, with unrestricted hearing on the contralateral side: Secondary | ICD-10-CM

## 2023-10-05 DIAGNOSIS — R0981 Nasal congestion: Secondary | ICD-10-CM

## 2023-10-05 MED ORDER — METHOCARBAMOL 750 MG PO TABS: 750.0000 mg | ORAL_TABLET | Freq: Every evening | ORAL | 1 refills | Status: AC | PRN

## 2023-10-08 DIAGNOSIS — J310 Chronic rhinitis: Secondary | ICD-10-CM | POA: Insufficient documentation

## 2023-10-08 DIAGNOSIS — H9203 Otalgia, bilateral: Secondary | ICD-10-CM | POA: Insufficient documentation

## 2023-10-08 DIAGNOSIS — H9041 Sensorineural hearing loss, unilateral, right ear, with unrestricted hearing on the contralateral side: Secondary | ICD-10-CM | POA: Insufficient documentation

## 2023-10-08 DIAGNOSIS — J343 Hypertrophy of nasal turbinates: Secondary | ICD-10-CM | POA: Insufficient documentation

## 2023-10-08 NOTE — Progress Notes (Signed)
 Patient ID: Audrey Sweeney, female   DOB: 1946/07/28, 78 y.o.   MRN: [id number]  Follow-up: Chronic nasal congestion, recurrent sinusitis, right ear hearing loss New complaint: Bilateral otalgia  HPI: The patient is a 78 year old female who presents today with a new complaint of bilateral otalgia.  The patient was previously seen for her chronic nasal congestion, recurrent sinusitis, and right ear hearing loss.  According to the patient, she has noted intermittent bilateral otalgia for the past 2 days.  She denies any otorrhea or change in her hearing.  She continues to have chronic nasal congestion.  She is currently on Nasonex and Allegra-D.  She denies any facial pain, fever, or visual change.  Exam: General: Communicates without difficulty, well nourished, no acute distress. Head: Normocephalic, no evidence injury, no tenderness, facial buttresses intact without stepoff. Face/sinus: No tenderness to palpation and percussion. Facial movement is normal and symmetric. Eyes: PERRL, EOMI. No scleral icterus, conjunctivae clear. Neuro: CN II exam reveals vision grossly intact.  No nystagmus at any point of gaze. Ears: Auricles well formed without lesions.  Ear canals are intact without mass or lesion.  No erythema or edema is appreciated.  The TMs are intact without fluid.  Bilateral TMJ areas are tender to palpation.  Nose: External evaluation reveals normal support and skin without lesions.  Dorsum is intact.  Anterior rhinoscopy reveals congested mucosa over anterior aspect of inferior turbinates and intact septum.  No purulence noted. Oral:  Oral cavity and oropharynx are intact, symmetric, without erythema or edema.  Mucosa is moist without lesions. Neck: Full range of motion without pain.  There is no significant lymphadenopathy.  No masses palpable.  Thyroid bed within normal limits to palpation.  Parotid glands and submandibular glands equal bilaterally without mass.  Trachea is midline. Neuro:  CN  2-12 grossly intact.   Procedure:  Flexible Nasal Endoscopy: Description: Risks, benefits, and alternatives of flexible endoscopy were explained to the patient.  Specific mention was made of the risk of throat numbness with difficulty swallowing, possible bleeding from the nose and mouth, and pain from the procedure.  The patient gave oral consent to proceed.  The flexible scope was inserted into the right nasal cavity.  Endoscopy of the interior nasal cavity, superior, inferior, and middle meatus was performed. The sphenoid-ethmoid recess was examined. Edematous mucosa was noted.  No polyp, mass, or lesion was appreciated. Olfactory cleft was clear.  Nasopharynx was clear.  Turbinates were hypertrophied but without mass.  The procedure was repeated on the contralateral side with similar findings.  The patient tolerated the procedure well.    Assessment: 1.  Bilateral referred otalgia, likely secondary to bilateral TMJ disorder. 2.  Her ear canals, tympanic membranes, and middle ear spaces are all normal.  No middle ear effusion or infection is noted. 3.  Chronic rhinitis with nasal mucosal congestion and bilateral inferior turbinate hypertrophy.  No acute sinusitis is noted today. 4.  Subjectively stable asymmetric right ear low-frequency sensorineural hearing loss.  Her previous MRI scan was negative.  Plan: 1.  The physical exam and nasal endoscopy findings are reviewed with the patient. 2.  The patient is reassured that no sinus or ear infection is noted today. 3.  Robaxin as needed to treat the referred otalgia.  The patient may also use NSAIDs as needed. 4.  Continue with Nasonex nasal spray 2 sprays each nostril daily. 5.  The patient will return for reevaluation in 6 months, sooner if needed.

## 2023-11-22 DIAGNOSIS — H532 Diplopia: Secondary | ICD-10-CM | POA: Diagnosis not present

## 2023-11-22 DIAGNOSIS — H518 Other specified disorders of binocular movement: Secondary | ICD-10-CM | POA: Diagnosis not present

## 2023-11-27 ENCOUNTER — Other Ambulatory Visit: Payer: Self-pay | Admitting: Radiology

## 2023-11-27 DIAGNOSIS — M810 Age-related osteoporosis without current pathological fracture: Secondary | ICD-10-CM

## 2023-11-27 NOTE — Telephone Encounter (Signed)
 Med refill request: Prolia   Last AEX: 03/30/23 Next AEX: 04/02/24 Last MMG (if hormonal med) 03/17/23 birads cat 1 neg  Refill authorized: last rx 06/08/23, please approve or deny

## 2023-11-30 ENCOUNTER — Telehealth: Payer: Self-pay | Admitting: *Deleted

## 2023-11-30 DIAGNOSIS — M810 Age-related osteoporosis without current pathological fracture: Secondary | ICD-10-CM

## 2023-11-30 NOTE — Telephone Encounter (Signed)
 Call returned to patient.   Patient was contacted by [facility] regarding refill for Prolia .   Last Prolia  received 06/22/23 Due after 12/19/23  Patient reports no changes in health.  No upcoming dental procedures, cleaning scheduled for summer.   Confirmed insurance on file.   Advised patient I will forward to [facility] for review and she will contact you once benefits are updated. Patient agreeable and appreciative of call.   Routing to [employer].

## 2023-12-06 ENCOUNTER — Other Ambulatory Visit: Payer: Self-pay | Admitting: Radiology

## 2023-12-06 DIAGNOSIS — M810 Age-related osteoporosis without current pathological fracture: Secondary | ICD-10-CM

## 2023-12-06 MED ORDER — PROLIA 60 MG/ML ~~LOC~~ SOSY: 60.0000 mg | PREFILLED_SYRINGE | SUBCUTANEOUS | 0 refills | Status: DC

## 2023-12-06 NOTE — Telephone Encounter (Signed)
 Prolia  #1, 0RF pended to [facility] for review of Jami, NP.

## 2023-12-18 NOTE — Telephone Encounter (Signed)
 Routing to [place] to f/u with patient.

## 2023-12-19 ENCOUNTER — Other Ambulatory Visit: Payer: Self-pay | Admitting: *Deleted

## 2023-12-19 DIAGNOSIS — M810 Age-related osteoporosis without current pathological fracture: Secondary | ICD-10-CM

## 2023-12-19 MED ORDER — DENOSUMAB 60 MG/ML ~~LOC~~ SOSY
60.0000 mg | PREFILLED_SYRINGE | SUBCUTANEOUS | Status: AC
  Administered 2023-12-27: 60 mg via SUBCUTANEOUS

## 2023-12-21 NOTE — Telephone Encounter (Signed)
 Prolia  received in office today. See referral. Encounter closed.

## 2023-12-27 ENCOUNTER — Ambulatory Visit (INDEPENDENT_AMBULATORY_CARE_PROVIDER_SITE_OTHER)

## 2023-12-27 DIAGNOSIS — M810 Age-related osteoporosis without current pathological fracture: Secondary | ICD-10-CM | POA: Diagnosis not present

## 2024-02-16 ENCOUNTER — Other Ambulatory Visit: Payer: Self-pay | Admitting: Radiology

## 2024-02-16 DIAGNOSIS — Z1231 Encounter for screening mammogram for malignant neoplasm of breast: Secondary | ICD-10-CM

## 2024-02-27 DIAGNOSIS — C9110 Chronic lymphocytic leukemia of B-cell type not having achieved remission: Secondary | ICD-10-CM | POA: Diagnosis not present

## 2024-02-27 DIAGNOSIS — E7800 Pure hypercholesterolemia, unspecified: Secondary | ICD-10-CM | POA: Diagnosis not present

## 2024-02-27 DIAGNOSIS — I10 Essential (primary) hypertension: Secondary | ICD-10-CM | POA: Diagnosis not present

## 2024-02-27 DIAGNOSIS — E559 Vitamin D deficiency, unspecified: Secondary | ICD-10-CM | POA: Diagnosis not present

## 2024-03-08 DIAGNOSIS — I10 Essential (primary) hypertension: Secondary | ICD-10-CM | POA: Diagnosis not present

## 2024-03-08 DIAGNOSIS — C9110 Chronic lymphocytic leukemia of B-cell type not having achieved remission: Secondary | ICD-10-CM | POA: Diagnosis not present

## 2024-03-08 DIAGNOSIS — R5383 Other fatigue: Secondary | ICD-10-CM | POA: Diagnosis not present

## 2024-03-08 DIAGNOSIS — E7800 Pure hypercholesterolemia, unspecified: Secondary | ICD-10-CM | POA: Diagnosis not present

## 2024-03-08 DIAGNOSIS — Z0000 Encounter for general adult medical examination without abnormal findings: Secondary | ICD-10-CM | POA: Diagnosis not present

## 2024-03-08 DIAGNOSIS — Z6823 Body mass index (BMI) 23.0-23.9, adult: Secondary | ICD-10-CM | POA: Diagnosis not present

## 2024-03-08 DIAGNOSIS — E559 Vitamin D deficiency, unspecified: Secondary | ICD-10-CM | POA: Diagnosis not present

## 2024-03-18 ENCOUNTER — Ambulatory Visit
Admission: RE | Admit: 2024-03-18 | Discharge: 2024-03-18 | Disposition: A | Discharge status: Home / Self Care | Source: Ambulatory Visit | Attending: Radiology | Admitting: Radiology

## 2024-03-18 DIAGNOSIS — Z1231 Encounter for screening mammogram for malignant neoplasm of breast: Secondary | ICD-10-CM | POA: Diagnosis not present

## 2024-03-27 DIAGNOSIS — H532 Diplopia: Secondary | ICD-10-CM | POA: Diagnosis not present

## 2024-03-27 DIAGNOSIS — H518 Other specified disorders of binocular movement: Secondary | ICD-10-CM | POA: Diagnosis not present

## 2024-04-02 ENCOUNTER — Ambulatory Visit: Payer: PPO | Admitting: Radiology

## 2024-04-02 DIAGNOSIS — R5383 Other fatigue: Secondary | ICD-10-CM | POA: Diagnosis not present

## 2024-04-02 DIAGNOSIS — R52 Pain, unspecified: Secondary | ICD-10-CM | POA: Diagnosis not present

## 2024-04-02 DIAGNOSIS — J029 Acute pharyngitis, unspecified: Secondary | ICD-10-CM | POA: Diagnosis not present

## 2024-04-02 DIAGNOSIS — U071 COVID-19: Secondary | ICD-10-CM | POA: Diagnosis not present

## 2024-04-02 DIAGNOSIS — R509 Fever, unspecified: Secondary | ICD-10-CM | POA: Diagnosis not present

## 2024-04-05 ENCOUNTER — Other Ambulatory Visit: Payer: PPO

## 2024-04-05 ENCOUNTER — Inpatient Hospital Stay

## 2024-04-05 ENCOUNTER — Inpatient Hospital Stay: Admitting: Hematology and Oncology

## 2024-04-05 ENCOUNTER — Ambulatory Visit: Payer: PPO | Admitting: Hematology and Oncology

## 2024-04-08 ENCOUNTER — Ambulatory Visit (INDEPENDENT_AMBULATORY_CARE_PROVIDER_SITE_OTHER): Admitting: Otolaryngology

## 2024-04-11 ENCOUNTER — Ambulatory Visit (INDEPENDENT_AMBULATORY_CARE_PROVIDER_SITE_OTHER): Admitting: Radiology

## 2024-04-11 ENCOUNTER — Encounter: Payer: Self-pay | Admitting: Radiology

## 2024-04-11 VITALS — BP 136/72 | HR 105 | Ht 65.5 in | Wt 138.0 lb

## 2024-04-11 DIAGNOSIS — M810 Age-related osteoporosis without current pathological fracture: Secondary | ICD-10-CM | POA: Diagnosis not present

## 2024-04-11 DIAGNOSIS — N952 Postmenopausal atrophic vaginitis: Secondary | ICD-10-CM

## 2024-04-11 MED ORDER — ESTRADIOL 10 MCG VA TABS: ORAL_TABLET | VAGINAL | 3 refills | Status: AC

## 2024-04-11 NOTE — Progress Notes (Signed)
 CHERA SLIVKA Mar 28, 1946 [id number]   History: Postmenopausal 78 y.o. presents for med check. Using vaginal estrogen twice weekly.  Gynecologic History Postmenopausal Last Pap: 2020. Results were: normal Last mammogram: 03/18/24. Results were: normal Last colonoscopy: 2023 [id number] Worsening osteoporosis, on Prolia    Risk Factors for Medicare Patients >/= 5 sexual partners in a lifetime: No First intercourse <42 years of age: No H/O STD at any age: No Abnormal pap smear, < 3 negative paps within the last 7 years: No DES exposure (women born between [id number]): No Patient is on post breast cancer medication like Femara or, if medication like this is not needed, 5 years post breast cancer diagnosis: No   Obstetric History OB History  Gravida Para Term Preterm AB Living  3 3    3   SAB IAB Ectopic Multiple Live Births     1 4    # Outcome Date GA Lbr Len/2nd Weight Sex Type Anes PTL Lv  3 Para     M Vag-Spont   LIV  2 Para     M Vag-Spont   LIV  1A Para     F CS-Classical   DEC  1B Para     F CS-Classical   LIV    Obstetric Comments  1 child deceased     The following portions of the patient's history were reviewed and updated as appropriate: allergies, current medications, past family history, past medical history, past social history, past surgical history, and problem list.  Review of Systems Pertinent items noted in HPI and remainder of comprehensive ROS otherwise negative.  Past medical history, past surgical history, family history and social history were all reviewed and documented in the EPIC chart.  Narrative & Impression  EXAM: DUAL X-RAY ABSORPTIOMETRY (DXA) FOR BONE MINERAL DENSITY   IMPRESSION: Referring Physician:  COREAN MATTHEWS Your patient completed a bone mineral density test using GE Lunar iDXA system (analysis version: 16). Technologist: lmn PATIENT: Name: Lavere, Shinsky Patient ID: [id number] Birth Date: [birth year] Height: 64.8 in. Sex:  Female Measured: 08/24/2022 Weight: 144.6 lbs. Indications: Advanced Age, Caucasian, Estrogen Deficient, Postmenopausal Fractures: Wrist Treatments: Vitamin D (E933.5)   ASSESSMENT: The BMD measured at Femur Total Left is 0.636 g/cm2 with a T-score of -3.0. This patient is considered osteoporotic according to World Health Organization [facility]) criteria.   The quality of the exam is good. L3 and L4 were excluded due to degenerative changes. Site Region Measured Date Measured Age YA BMD Significant CHANGE T-score DualFemur Total Left 8/24/2022    76.1         -3.0    0.636 g/cm2 DualFemur Total Left 03/27/2019    72.7         -2.7    0.663 g/cm2   AP Spine  L1-L2      08/24/2022    76.1         -2.3    0.890 g/cm2 AP Spine  L1-L2      03/27/2019    72.7         -2.1    0.913 g/cm2   DualFemur Total Mean 08/24/2022    76.1         -2.8    0.658 g/cm2 DualFemur Total Mean 03/27/2019    72.7         -3.0    0.633 g/cm2   World Health Organization [facility]) criteria for post-menopausal, Caucasian Women: Normal       T-score at or above -1 SD  Osteopenia   T-score between -1 and -2.5 SD Osteoporosis T-score at or below -2.5 SD   RECOMMENDATION: 1. All patients should optimize calcium and vitamin D intake. 2. Consider FDA-approved medical therapies in postmenopausal women and men aged 54 years and older, based on the following: a. A hip or vertebral (clinical or morphometric) fracture. b. T-score = -2.5 at the femoral neck or spine after appropriate evaluation to exclude secondary causes. c. Low bone mass (T-score between -1.0 and -2.5 at the femoral neck or spine) and a 10-year probability of a hip fracture = 3% or a 10-year probability of a major osteoporosis-related fracture = 20% based on the [place] -adapted WHO algorithm. d. Clinician judgment and/or patient preferences may indicate treatment for people with 10-year fracture probabilities above or below these levels.    FOLLOW-UP: Patients with diagnosis of osteoporosis or at high risk for fracture should have regular bone mineral density tests.? Patients eligible for Medicare are allowed routine testing every 2 years.? The testing frequency can be increased to one year for patients who have rapidly progressing disease, are receiving or discontinuing medical therapy to restore bone mass, or have additional risk factors.   I have reviewed this study and agree with the findings. [facility]     Electronically Signed   By: Norman Hopper M.D.   On: 08/24/2022 08:04    Exam:  Vitals:   04/11/24 1459  BP: 136/72  Pulse: (!) 105  SpO2: 99%  Weight: 138 lb (62.6 kg)  Height: 5' 5.5 (1.664 m)   Body mass index is 22.62 kg/m.  General appearance:  Normal Thyroid :  Symmetrical, normal in size, without palpable masses or nodularity. Respiratory  Auscultation:  Clear without wheezing or rhonchi Cardiovascular  Auscultation:  Regular rate, without rubs, murmurs or gallops  Edema/varicosities:  Not grossly evident Abdominal  Soft,nontender, without masses, guarding or rebound.  Liver/spleen:  No organomegaly noted  Hernia:  None appreciated  Skin  Inspection:  Grossly normal Breasts: Examined lying and sitting.   Right: Without masses, retractions, nipple discharge or axillary adenopathy.   Left: Without masses, retractions, nipple discharge or axillary adenopathy. Genitourinary   Inguinal/mons:  Normal without inguinal adenopathy  External genitalia:  Normal appearing vulva with no masses, tenderness, or lesions  BUS/Urethra/Skene's glands:  Normal  Vagina:  Normal appearing with normal color and discharge, no lesions. Atrophy: mild   Cervix:  Normal appearing without discharge or lesions  Uterus:  Normal in size, shape and contour.  Midline and mobile, nontender  Adnexa/parametria:     Rt: Normal in size, without masses or tenderness.   Lt: Normal in size, without masses  or tenderness.  Anus and perineum: Normal    Darice Hoit, CMA present for exam  Assessment/Plan:   1. Vaginal atrophy (Primary) - Estradiol  10 MCG TABS vaginal tablet; INSERT 1 TABLET VAGINALLY TWICE A WEEK.  Dispense: 24 tablet; Refill: 3  2. Age-related osteoporosis without current pathological fracture Continue Prolia , next DEXA due 1/26    Return in 1 year for annual or sooner prn.  Twyla Dais B WHNP-BC, 3:04 PM 04/11/2024

## 2024-04-19 ENCOUNTER — Inpatient Hospital Stay: Admitting: Hematology and Oncology

## 2024-04-19 ENCOUNTER — Inpatient Hospital Stay: Attending: Hematology and Oncology

## 2024-04-19 ENCOUNTER — Encounter: Payer: Self-pay | Admitting: Hematology and Oncology

## 2024-04-19 VITALS — BP 132/57 | HR 81 | Temp 97.7°F | Resp 18 | Ht 65.5 in | Wt 138.9 lb

## 2024-04-19 DIAGNOSIS — C9110 Chronic lymphocytic leukemia of B-cell type not having achieved remission: Secondary | ICD-10-CM | POA: Diagnosis not present

## 2024-04-19 LAB — CBC WITH DIFFERENTIAL/PLATELET
Abs Immature Granulocytes: 0.02 K/uL (ref 0.00–0.07)
Basophils Absolute: 0.1 K/uL (ref 0.0–0.1)
Basophils Relative: 1 %
Eosinophils Absolute: 0.2 K/uL (ref 0.0–0.5)
Eosinophils Relative: 3 %
HCT: 36 % (ref 36.0–46.0)
Hemoglobin: 12.2 g/dL (ref 12.0–15.0)
Immature Granulocytes: 0 %
Lymphocytes Relative: 27 %
Lymphs Abs: 1.6 K/uL (ref 0.7–4.0)
MCH: 28.4 pg (ref 26.0–34.0)
MCHC: 33.9 g/dL (ref 30.0–36.0)
MCV: 83.7 fL (ref 80.0–100.0)
Monocytes Absolute: 0.4 K/uL (ref 0.1–1.0)
Monocytes Relative: 7 %
Neutro Abs: 3.7 K/uL (ref 1.7–7.7)
Neutrophils Relative %: 62 %
Platelets: 322 K/uL (ref 150–400)
RBC: 4.3 MIL/uL (ref 3.87–5.11)
RDW: 12.3 % (ref 11.5–15.5)
WBC: 6 K/uL (ref 4.0–10.5)
nRBC: 0 % (ref 0.0–0.2)

## 2024-04-19 NOTE — Assessment & Plan Note (Addendum)
 The patient was diagnosed with CLL in 2005, flow cytometry and FISH analysis confirmed the diagnosis. FISH showed deletion 13 q., Rai stage 0  She returns for her yearly follow-up for CLL She remained asymptomatic We discussed signs and symptoms to watch out for I plan to see her in a year for further follow-up

## 2024-04-19 NOTE — Progress Notes (Signed)
  [name] OFF[facility] PROGRESS NOTE  Patient Care Team: Claudene Lacks, MD as PCP - General (Family Medicine) Lonn Hicks, MD as Consulting Physician (Hematology and Oncology) Livingston Rigg, MD as Consulting Physician (Dermatology) Ginette Shasta NOVAK, NP as Nurse Practitioner (Obstetrics and Gynecology)  Assessment & Plan CLL (chronic lymphocytic leukemia) Audrey [facility]ey) The patient was diagnosed with CLL in 2005, flow cytometry and FISH analysis confirmed the diagnosis. FISH showed deletion 13 q., Rai stage 0  She returns for her yearly follow-up for CLL She remained asymptomatic We discussed signs and symptoms to watch out for I plan to see her in a year for further follow-up  No orders of the defined types were placed in this encounter.    Hicks Lonn, MD  INTERVAL HISTORY: she returns for surveillance follow-up for CLL She had COVID infection several weeks ago, completely recovered No new lymphadenopathy  PHYSICAL EXAMINATION: ECOG PERFORMANCE STATUS: 0 - Asymptomatic  Vitals:   04/19/24 0935  BP: (!) 132/57  Pulse: 81  Resp: 18  Temp: 97.7 F (36.5 C)  SpO2: 100%   Filed Weights   04/19/24 0935  Weight: 138 lb 14.4 oz (63 kg)   GENERAL:alert, no distress and comfortable SKIN: skin color, texture, turgor are normal, no rashes or significant lesions EYES: normal, conjunctiva are pink and non-injected, sclera clear OROPHARYNX:no exudate, no erythema and lips, buccal mucosa, and tongue normal  NECK: supple, thyroid  normal size, non-tender, without nodularity LYMPH:  no palpable lymphadenopathy in the cervical, axillary or inguinal LUNGS: clear to auscultation and percussion with normal breathing effort HEART: regular rate & rhythm and no murmurs and no lower extremity edema ABDOMEN:abdomen soft, non-tender and normal bowel sounds Musculoskeletal:no cyanosis of digits and no clubbing  PSYCH: alert & oriented x 3 with fluent speech NEURO: no focal  motor/sensory deficits  Relevant data reviewed during this visit included CBC

## 2024-04-24 DIAGNOSIS — J0100 Acute maxillary sinusitis, unspecified: Secondary | ICD-10-CM | POA: Diagnosis not present

## 2024-04-30 ENCOUNTER — Ambulatory Visit (INDEPENDENT_AMBULATORY_CARE_PROVIDER_SITE_OTHER): Admitting: Otolaryngology

## 2024-05-20 ENCOUNTER — Ambulatory Visit (INDEPENDENT_AMBULATORY_CARE_PROVIDER_SITE_OTHER): Admitting: Otolaryngology

## 2024-05-20 ENCOUNTER — Encounter (INDEPENDENT_AMBULATORY_CARE_PROVIDER_SITE_OTHER): Payer: Self-pay | Admitting: Otolaryngology

## 2024-05-20 VITALS — BP 116/67 | HR 80 | Temp 98.6°F | Ht 65.5 in | Wt 140.0 lb

## 2024-05-20 DIAGNOSIS — R0981 Nasal congestion: Secondary | ICD-10-CM | POA: Diagnosis not present

## 2024-05-20 DIAGNOSIS — H9203 Otalgia, bilateral: Secondary | ICD-10-CM | POA: Diagnosis not present

## 2024-05-20 DIAGNOSIS — J343 Hypertrophy of nasal turbinates: Secondary | ICD-10-CM

## 2024-05-20 DIAGNOSIS — H9041 Sensorineural hearing loss, unilateral, right ear, with unrestricted hearing on the contralateral side: Secondary | ICD-10-CM

## 2024-05-20 DIAGNOSIS — J310 Chronic rhinitis: Secondary | ICD-10-CM | POA: Diagnosis not present

## 2024-05-21 NOTE — Progress Notes (Signed)
 Patient ID: Audrey Sweeney, female   DOB: 11-26-1945, 78 y.o.   MRN: [id number]  Follow-up: Bilateral otalgia, chronic nasal congestion, recurrent sinusitis, right ear hearing loss  HPI: The patient is a 78 year old female who returns today for her follow-up evaluation.  She was last seen in March 2025.  At that time, she was complaining of bilateral otalgia.  She was diagnosed with referred otalgia, secondary to bilateral TMJ disorder.  She also has a history of chronic rhinitis, with nasal mucosal congestion, and bilateral inferior turbinate hypertrophy.  She has asymmetric right ear low-frequency sensorineural hearing loss.  Her previous MRI scan was negative.  The patient returns today complaining of intermittent bilateral otalgia.  The severity has decreased.  She had an episode of acute sinusitis in September, while she was infected with COVID.  She is currently on Nasonex and Allegra-D daily.  Currently she denies any facial pain, fever, or visual change.  Exam: General: Communicates without difficulty, well nourished, no acute distress. Head: Normocephalic, no evidence injury, no tenderness, facial buttresses intact without stepoff. Face/sinus: No tenderness to palpation and percussion. Facial movement is normal and symmetric. Eyes: PERRL, EOMI. No scleral icterus, conjunctivae clear. Neuro: CN II exam reveals vision grossly intact.  No nystagmus at any point of gaze. Ears: Auricles well formed without lesions.  Ear canals are intact without mass or lesion.  No erythema or edema is appreciated.  The TMs are intact without fluid. Nose: External evaluation reveals normal support and skin without lesions.  Dorsum is intact.  Anterior rhinoscopy reveals congested mucosa over anterior aspect of inferior turbinates and intact septum.  No purulence noted. Oral:  Oral cavity and oropharynx are intact, symmetric, without erythema or edema.  Mucosa is moist without lesions. Neck: Full range of motion without  pain.  There is no significant lymphadenopathy.  No masses palpable.  Thyroid  bed within normal limits to palpation.  Parotid glands and submandibular glands equal bilaterally without mass.  Trachea is midline. Neuro:  CN 2-12 grossly intact.   Assessment: 1.  Chronic rhinitis with nasal mucosal congestion and bilateral inferior turbinate hypertrophy.  Her sinusitis has resolved.  No purulent drainage is noted today. 2.  Bilateral referred otalgia, secondary to TMJ disorder.  Her ear canals, tympanic membranes, and middle ear spaces are normal. 3.  Asymmetric right ear low-frequency sensorineural hearing loss.  Her MRI scan was negative for retrocochlear lesion.  Plan: 1.  The physical exam findings are reviewed with the patient. 2.  Continue with Nasonex and Allegra-D to treat her chronic rhinitis. 3.  Robaxin  and NSAID as needed to treat the referred otalgia. 4.  The patient will return for reevaluation in 6 months.

## 2024-05-25 DIAGNOSIS — W19XXXA Unspecified fall, initial encounter: Secondary | ICD-10-CM | POA: Diagnosis not present

## 2024-05-25 DIAGNOSIS — S0181XA Laceration without foreign body of other part of head, initial encounter: Secondary | ICD-10-CM | POA: Diagnosis not present

## 2024-05-25 DIAGNOSIS — S01511A Laceration without foreign body of lip, initial encounter: Secondary | ICD-10-CM | POA: Diagnosis not present

## 2024-05-25 DIAGNOSIS — S0993XA Unspecified injury of face, initial encounter: Secondary | ICD-10-CM | POA: Diagnosis not present

## 2024-05-26 DIAGNOSIS — S0993XA Unspecified injury of face, initial encounter: Secondary | ICD-10-CM | POA: Diagnosis not present

## 2024-05-27 ENCOUNTER — Other Ambulatory Visit: Payer: Self-pay | Admitting: Radiology

## 2024-05-27 DIAGNOSIS — M810 Age-related osteoporosis without current pathological fracture: Secondary | ICD-10-CM

## 2024-05-28 NOTE — Telephone Encounter (Signed)
 Med refill request:   denosumab  (PROLIA ) 60 MG/ML SOSY injection  Start:  12/06/23 Dispense: 1 mL - Refills: 0 ordered  Last OV:  04/11/24 Last AEX:  03/30/23 Next B&P:  04/15/25 Last MMG (if hormonal med): 03/18/24 Refill authorized? Please Advise.

## 2024-05-30 DIAGNOSIS — H18593 Other hereditary corneal dystrophies, bilateral: Secondary | ICD-10-CM | POA: Diagnosis not present

## 2024-05-30 DIAGNOSIS — H52203 Unspecified astigmatism, bilateral: Secondary | ICD-10-CM | POA: Diagnosis not present

## 2024-05-30 DIAGNOSIS — H5000 Unspecified esotropia: Secondary | ICD-10-CM | POA: Diagnosis not present

## 2024-05-30 DIAGNOSIS — Z961 Presence of intraocular lens: Secondary | ICD-10-CM | POA: Diagnosis not present

## 2024-05-31 DIAGNOSIS — S0993XA Unspecified injury of face, initial encounter: Secondary | ICD-10-CM | POA: Diagnosis not present

## 2024-06-04 ENCOUNTER — Other Ambulatory Visit

## 2024-06-04 ENCOUNTER — Other Ambulatory Visit: Admitting: Obstetrics and Gynecology

## 2024-06-24 ENCOUNTER — Encounter: Payer: Self-pay | Admitting: *Deleted

## 2024-06-24 ENCOUNTER — Other Ambulatory Visit: Payer: Self-pay | Admitting: *Deleted

## 2024-06-24 MED ORDER — DENOSUMAB 60 MG/ML ~~LOC~~ SOSY: 60.0000 mg | PREFILLED_SYRINGE | SUBCUTANEOUS | Status: DC

## 2024-06-25 ENCOUNTER — Telehealth: Payer: Self-pay

## 2024-06-25 DIAGNOSIS — M810 Age-related osteoporosis without current pathological fracture: Secondary | ICD-10-CM

## 2024-06-25 NOTE — Telephone Encounter (Signed)
 Prolia  VOB initiated via [email or web address]  Next Prolia  inj DUE: 06/28/24

## 2024-06-26 ENCOUNTER — Other Ambulatory Visit (HOSPITAL_COMMUNITY): Payer: Self-pay

## 2024-06-26 NOTE — Telephone Encounter (Signed)
 Audrey Sweeney

## 2024-06-26 NOTE — Telephone Encounter (Signed)
 Proceed with MTDM services  Med obtained from pharmacy and shipped to clinic: Prolia  is no longer preferred for pharmacy benefit. Jubbonti is now preferred. PRICING IS FOR JUBBONTI  Pharmacy benefit: Copay $250 (Paid to pharmacy) Admin Fee: 20% (Pay at clinic)  Prior Auth: N/A PA# Expiration Date:   # of doses approved:   Patient NOT eligible for Copay Card. Copay Card can make patient's cost as little as $25. Link to apply: https://www.amgensupportplus.com/copay  ** This summary of benefits is an estimation of the patient's out-of-pocket cost. Exact cost may very based on individual plan coverage.

## 2024-07-03 MED ORDER — JUBBONTI 60 MG/ML ~~LOC~~ SOSY: 60.0000 mg | PREFILLED_SYRINGE | SUBCUTANEOUS | 1 refills | Status: DC

## 2024-07-03 NOTE — Telephone Encounter (Signed)
 Prescription for Jubbonti #1, 1RF sent to [facility].

## 2024-07-03 NOTE — Addendum Note (Signed)
 Addended by: MELITON PERKINS T on: 07/03/2024 11:42 AM   Modules accepted: Orders

## 2024-07-05 ENCOUNTER — Other Ambulatory Visit: Payer: Self-pay

## 2024-07-05 NOTE — Progress Notes (Signed)
 Waiting to speak with patient about copay. Will need MTDM visit.

## 2024-07-09 ENCOUNTER — Other Ambulatory Visit: Payer: Self-pay

## 2024-07-09 ENCOUNTER — Other Ambulatory Visit (HOSPITAL_COMMUNITY): Payer: Self-pay

## 2024-07-09 ENCOUNTER — Ambulatory Visit: Attending: Radiology

## 2024-07-09 DIAGNOSIS — M810 Age-related osteoporosis without current pathological fracture: Secondary | ICD-10-CM

## 2024-07-09 DIAGNOSIS — Z79899 Other long term (current) drug therapy: Secondary | ICD-10-CM

## 2024-07-09 MED ORDER — JUBBONTI 60 MG/ML ~~LOC~~ SOSY
60.0000 mg | PREFILLED_SYRINGE | SUBCUTANEOUS | 1 refills | Status: AC
  Filled 2024-07-09: qty 1, 180d supply, fill #0

## 2024-07-09 NOTE — Progress Notes (Signed)
 Patient is agreeable to $250 copay. Patient aware she will need to speak with pharmacist before medication can be dispensed. MTDM visit scheduled. Will finish initial onboarding documentation once visit with Jen is complete.

## 2024-07-09 NOTE — Progress Notes (Signed)
 [facility]  Referring Provider: Shasta Bland  Virtual Visit via Telephone Note  I connected with Audrey Sweeney on 07/09/24 at  3:30 PM EST by telephone and verified that I am speaking with the correct person using two identifiers.  Location: Patient: home Provider: office   I discussed the limitations, risks, security and privacy concerns of performing an evaluation and management service by telephone and the availability of in person appointments. I also discussed with the patient that there may be a patient responsible charge related to this service. The patient expressed understanding and agreed to proceed.   HPI: Audrey Sweeney is a 78 y.o. female who presents to the pharmacotherapy clinic via telephone for continuation of therapy for osteoporosis.  Patient Active Problem List   Diagnosis Date Noted   Chronic rhinitis 10/08/2023   Hypertrophy of nasal turbinates 10/08/2023   Otalgia, bilateral 10/08/2023   Sensorineural hearing loss, unilateral, right ear, with unrestricted hearing on the contralateral side 10/08/2023   Intermittent esotropia 11/13/2019   Abnormal mammogram of right breast 01/31/2019   Chronic sinusitis, unspecified 01/23/2018   Acquired hypogammaglobulinemia 01/23/2018   Diverticulosis 03/10/2017   Family history of colon cancer 03/10/2017   History of adenomatous polyp of colon 03/10/2017   RA (rheumatoid arthritis) (HCC) 03/10/2017   Fatty infiltration of liver 01/23/2017   CLL (chronic lymphocytic leukemia) (HCC) 11/13/2006    Patient's Medications  New Prescriptions   No medications on file  Previous Medications   CHOLECALCIFEROL (VITAMIN D3) 25 MCG (1000 UNIT) TABLET    Take 2,000 Units by mouth daily.   DENOSUMAB -BBDZ (JUBBONTI ) 60 MG/ML SOSY INJECTION    Inject 60 mg into the skin every 6 (six) months.   ESTRADIOL  10 MCG TABS VAGINAL TABLET    INSERT 1 TABLET VAGINALLY TWICE A WEEK.   EZETIMIBE (ZETIA) 10 MG  TABLET    1 tablet Orally Once a day for 90 days   FEXOFENADINE-PSEUDOEPHEDRINE (ALLEGRA-D PO)    Take by mouth.   HYDROCHLOROTHIAZIDE (HYDRODIURIL) 25 MG TABLET    Take 0.5 tablets by mouth daily.   IBUPROFEN (ADVIL) 200 MG TABLET       MOMETASONE (NASONEX) 50 MCG/ACT NASAL SPRAY    Place 2 sprays into the nose daily.   MULTIPLE VITAMIN (MULTIVITAMIN WITH MINERALS) TABS TABLET    Take 1 tablet by mouth daily.   OMEGA-3 1400 MG CAPS    Take by mouth.   POLYETHYL GLYCOL-PROPYL GLYCOL (SYSTANE PRESERVATIVE FREE OP)    Apply to eye.   PROLIA  60 MG/ML SOSY INJECTION    INJECT 1 SYRINGE UNDER THE SKIN ONCE EVERY 6 MONTHS  Modified Medications   No medications on file  Discontinued Medications   No medications on file    Allergies: Allergies  Allergen Reactions   Contrast Media [Iodinated Contrast Media] Shortness Of Breath    IV Contrast allergy.   Pt has SOB even with premedication.   Amoxicillin     Yeast infection   Influenza Virus Vaccine Live Hives    Uncertain what type of flu vaccine she had   Lipitor [Atorvastatin]     intolerant   Other     Dairy- stomach upset  Influenza vaccine types A & B PF- itching   Provera [Medroxyprogesterone Acetate]     Headache & depression   Sulfa Antibiotics     rash   Tetanus Toxoid-Containing Vaccines     Sick for 5 weeks after tetanus-diphth-acell pertussis  Past Medical History: Past Medical History:  Diagnosis Date   Abnormal MRI    Abnormal Pap smear of cervix    ?   Ankylosing spondylitis (HCC)    Arthritis    C. difficile colitis    Cervical disc disease    CLL (chronic lymphocytic leukemia) (HCC)    Rai stage 0.   Colon polyps    Depression    Hypercholesterolemia    Hypertension    IBS (irritable bowel syndrome)    Osteopenia    Osteoporosis    Rheumatoid arthritis(714.0)    Seasonal allergies    Statin intolerance     Social History: Social History   Socioeconomic History   Marital status: Married     Spouse name: Not on file   Number of children: 4   Years of education: Not on file   Highest education level: High school graduate  Occupational History   Not on file  Tobacco Use   Smoking status: Never    Passive exposure: Never   Smokeless tobacco: Never  Vaping Use   Vaping status: Never Used  Substance and Sexual Activity   Alcohol use: Never   Drug use: Never   Sexual activity: Yes    Partners: Male    Birth control/protection: Surgical    Comment: btl, less than 5 lifetime partners, no hx female cancers, no hx STI, over 16yo at first intercourse, no DES exposure  Other Topics Concern   Not on file  Social History Narrative   Lives at home with husband   Caffeine: 1 cup coffee in AM   Social Drivers of Corporate Investment Banker Strain: Not on file  Food Insecurity: Not on file  Transportation Needs: Not on file  Physical Activity: Not on file  Stress: Not on file  Social Connections: Not on file    Medication: Jubbonti  (denosumab -bbdz)  Assessment: Patient is currently stable and appropriate to continue therapy for osteoporosis with Jubbonti . Patient was not taking adequate calcium supplementation and was reminded of recommended daily dosing. She is in agreement to add to her regimen.  Vitamin D No results found for: VD25OH  Calcium Lab Results  Component Value Date   CALCIUM 9.8 05/04/2018    DEXA scan, most current DEXA scan T-score is minus 3.0(08/24/22). Next DEXA scan scheduled for 10/10/24.   Plan:  -Counseled patient on purpose, proper use, and adverse effects of Jubbonti  (denosumab -bbdz).  Counseled patient that medication must be injected every 6 months by a healthcare professional.  Advised patient to take calcium 1200 mg daily and vitamin D 800 units daily.  Reviewed the most common adverse effects including risk of infection, osteonecrosis of the jaw, rash, and muscle/bone pain.  Patient confirms she does not have any major dental work planned  at this time.  Reviewed with patient the signs/symptoms of low calcium and advised patient to alert us  if she experiences these symptoms.  - Patient will be scheduled for continuation of therapy Jubbonti  (denosumab -bbdz) appointment at [facility] when medication arrives to office.  - Rx will be triaged to [facility] for courier to [facility].   I discussed the assessment and treatment plan with the patient. The patient was provided an opportunity to ask questions and all were answered. The patient agreed with the plan and demonstrated an understanding of the instructions.   The patient was advised to call back or seek an in-person evaluation if the symptoms worsen or  if the condition fails to improve as anticipated.  I provided 15 minutes of non-face-to-face time during this encounter.  Delon Brow, PharmD, CSP, AAHIVP, CPP Clinical Pharmacist Practitioner - Medication Therapy Disease Management/Specialty Pharmacy Services 07/09/2024, 3:51 PM

## 2024-07-09 NOTE — Progress Notes (Signed)
 Specialty Pharmacy Initial Fill Coordination Note  Audrey Sweeney is a 78 y.o. female contacted today regarding initial fill of specialty medication(s) Denosumab -bbdz (Jubbonti )   Patient requested Courier to Provider Office   Delivery date: 07/11/24   Verified address: [facility]  [address] [phone]   Medication will be filled on: 07/10/24   Patient is aware of $250 copayment.

## 2024-07-10 ENCOUNTER — Other Ambulatory Visit: Payer: Self-pay

## 2024-07-17 ENCOUNTER — Other Ambulatory Visit: Payer: Self-pay | Admitting: *Deleted

## 2024-07-17 MED ORDER — DENOSUMAB-BBDZ 60 MG/ML ~~LOC~~ SOSY
60.0000 mg | PREFILLED_SYRINGE | SUBCUTANEOUS | Status: AC
  Administered 2024-07-22: 60 mg via SUBCUTANEOUS

## 2024-07-22 ENCOUNTER — Ambulatory Visit

## 2024-07-22 DIAGNOSIS — M810 Age-related osteoporosis without current pathological fracture: Secondary | ICD-10-CM | POA: Insufficient documentation

## 2024-10-10 ENCOUNTER — Other Ambulatory Visit (HOSPITAL_BASED_OUTPATIENT_CLINIC_OR_DEPARTMENT_OTHER)

## 2024-11-18 ENCOUNTER — Ambulatory Visit (INDEPENDENT_AMBULATORY_CARE_PROVIDER_SITE_OTHER): Admitting: Otolaryngology

## 2025-04-15 ENCOUNTER — Encounter: Admitting: Radiology

## 2025-04-18 ENCOUNTER — Inpatient Hospital Stay: Admitting: Hematology and Oncology

## 2025-04-18 ENCOUNTER — Inpatient Hospital Stay
